# Patient Record
Sex: Female | Born: 1960 | Race: White | Hispanic: No | Marital: Married | Smoking: Never smoker
Health system: Southern US, Community
[De-identification: ages and names within clinical notes are randomized; demographics above are authoritative.]

## PROBLEM LIST (undated history)

## (undated) DIAGNOSIS — Z87898 Personal history of other specified conditions: Secondary | ICD-10-CM

## (undated) DIAGNOSIS — R413 Other amnesia: Secondary | ICD-10-CM

## (undated) DIAGNOSIS — N393 Stress incontinence (female) (male): Secondary | ICD-10-CM

## (undated) DIAGNOSIS — F32A Depression, unspecified: Secondary | ICD-10-CM

## (undated) DIAGNOSIS — F329 Major depressive disorder, single episode, unspecified: Secondary | ICD-10-CM

## (undated) HISTORY — DX: Personal history of other specified conditions: Z87.898

## (undated) HISTORY — DX: Other amnesia: R41.3

## (undated) HISTORY — PX: APPENDECTOMY: SHX54

## (undated) HISTORY — DX: Stress incontinence (female) (male): N39.3

## (undated) HISTORY — PX: ORIF FINGER / THUMB FRACTURE: SUR932

## (undated) HISTORY — PX: ABDOMINAL HYSTERECTOMY: SUR658

---

## 2014-09-18 ENCOUNTER — Inpatient Hospital Stay (HOSPITAL_COMMUNITY): Payer: BC Managed Care – PPO

## 2014-09-18 ENCOUNTER — Encounter (HOSPITAL_COMMUNITY): Payer: Self-pay | Admitting: Internal Medicine

## 2014-09-18 ENCOUNTER — Inpatient Hospital Stay (HOSPITAL_COMMUNITY)
Admission: EM | Admit: 2014-09-18 | Discharge: 2014-09-21 | DRG: 419 | Disposition: A | Discharge status: Home / Self Care | Payer: BC Managed Care – PPO | Source: Other Acute Inpatient Hospital | Attending: Internal Medicine | Admitting: Internal Medicine

## 2014-09-18 DIAGNOSIS — E876 Hypokalemia: Secondary | ICD-10-CM | POA: Diagnosis present

## 2014-09-18 DIAGNOSIS — Z87442 Personal history of urinary calculi: Secondary | ICD-10-CM | POA: Diagnosis not present

## 2014-09-18 DIAGNOSIS — F329 Major depressive disorder, single episode, unspecified: Secondary | ICD-10-CM | POA: Diagnosis present

## 2014-09-18 DIAGNOSIS — K810 Acute cholecystitis: Secondary | ICD-10-CM | POA: Diagnosis present

## 2014-09-18 DIAGNOSIS — R7881 Bacteremia: Secondary | ICD-10-CM | POA: Diagnosis present

## 2014-09-18 DIAGNOSIS — R740 Nonspecific elevation of levels of transaminase and lactic acid dehydrogenase [LDH]: Secondary | ICD-10-CM | POA: Diagnosis present

## 2014-09-18 DIAGNOSIS — K8000 Calculus of gallbladder with acute cholecystitis without obstruction: Principal | ICD-10-CM

## 2014-09-18 DIAGNOSIS — B954 Other streptococcus as the cause of diseases classified elsewhere: Secondary | ICD-10-CM | POA: Diagnosis present

## 2014-09-18 DIAGNOSIS — R109 Unspecified abdominal pain: Secondary | ICD-10-CM | POA: Diagnosis present

## 2014-09-18 DIAGNOSIS — Z8719 Personal history of other diseases of the digestive system: Secondary | ICD-10-CM

## 2014-09-18 DIAGNOSIS — Z79899 Other long term (current) drug therapy: Secondary | ICD-10-CM | POA: Diagnosis not present

## 2014-09-18 DIAGNOSIS — R5081 Fever presenting with conditions classified elsewhere: Secondary | ICD-10-CM | POA: Diagnosis present

## 2014-09-18 DIAGNOSIS — Z791 Long term (current) use of non-steroidal anti-inflammatories (NSAID): Secondary | ICD-10-CM | POA: Diagnosis not present

## 2014-09-18 DIAGNOSIS — K8050 Calculus of bile duct without cholangitis or cholecystitis without obstruction: Secondary | ICD-10-CM

## 2014-09-18 DIAGNOSIS — Z8249 Family history of ischemic heart disease and other diseases of the circulatory system: Secondary | ICD-10-CM | POA: Diagnosis not present

## 2014-09-18 DIAGNOSIS — K8020 Calculus of gallbladder without cholecystitis without obstruction: Secondary | ICD-10-CM | POA: Diagnosis present

## 2014-09-18 DIAGNOSIS — K6289 Other specified diseases of anus and rectum: Secondary | ICD-10-CM | POA: Diagnosis present

## 2014-09-18 DIAGNOSIS — R1011 Right upper quadrant pain: Secondary | ICD-10-CM

## 2014-09-18 HISTORY — DX: Depression, unspecified: F32.A

## 2014-09-18 HISTORY — DX: Major depressive disorder, single episode, unspecified: F32.9

## 2014-09-18 LAB — COMPREHENSIVE METABOLIC PANEL
ALT: 106 U/L — ABNORMAL HIGH (ref 0–35)
ANION GAP: 12 (ref 5–15)
AST: 101 U/L — ABNORMAL HIGH (ref 0–37)
Albumin: 2.6 g/dL — ABNORMAL LOW (ref 3.5–5.2)
Alkaline Phosphatase: 231 U/L — ABNORMAL HIGH (ref 39–117)
BILIRUBIN TOTAL: 0.5 mg/dL (ref 0.3–1.2)
BUN: 12 mg/dL (ref 6–23)
CALCIUM: 8 mg/dL — AB (ref 8.4–10.5)
CO2: 27 mEq/L (ref 19–32)
CREATININE: 0.98 mg/dL (ref 0.50–1.10)
Chloride: 101 mEq/L (ref 96–112)
GFR calc Af Amer: 74 mL/min — ABNORMAL LOW (ref >90)
GFR calc non Af Amer: 64 mL/min — ABNORMAL LOW (ref >90)
Glucose, Bld: 103 mg/dL — ABNORMAL HIGH (ref 70–99)
Potassium: 3.6 mEq/L — ABNORMAL LOW (ref 3.7–5.3)
Sodium: 140 mEq/L (ref 137–147)
Total Protein: 6.5 g/dL (ref 6.0–8.3)

## 2014-09-18 LAB — URINALYSIS, ROUTINE W REFLEX MICROSCOPIC
BILIRUBIN URINE: NEGATIVE
Glucose, UA: NEGATIVE mg/dL
Hgb urine dipstick: NEGATIVE
Ketones, ur: NEGATIVE mg/dL
Leukocytes, UA: NEGATIVE
NITRITE: NEGATIVE
Protein, ur: NEGATIVE mg/dL
SPECIFIC GRAVITY, URINE: 1.02 (ref 1.005–1.030)
pH: 7 (ref 5.0–8.0)

## 2014-09-18 LAB — CBC
HEMATOCRIT: 36.1 % (ref 36.0–46.0)
Hemoglobin: 11.7 g/dL — ABNORMAL LOW (ref 12.0–15.0)
MCH: 26.8 pg (ref 26.0–34.0)
MCHC: 32.4 g/dL (ref 30.0–36.0)
MCV: 82.8 fL (ref 78.0–100.0)
Platelets: 324 10*3/uL (ref 150–400)
RBC: 4.36 MIL/uL (ref 3.87–5.11)
RDW: 13.5 % (ref 11.5–15.5)
WBC: 10.1 10*3/uL (ref 4.0–10.5)

## 2014-09-18 LAB — LIPASE, BLOOD: LIPASE: 34 U/L (ref 11–59)

## 2014-09-18 MED ORDER — HYDROMORPHONE HCL 1 MG/ML IJ SOLN
0.5000 mg | INTRAMUSCULAR | Status: DC | PRN
  Administered 2014-09-19 (×2): 1 mg via INTRAVENOUS
  Filled 2014-09-18 (×2): qty 1

## 2014-09-18 MED ORDER — SODIUM CHLORIDE 0.9 % IV SOLN
INTRAVENOUS | Status: DC
  Administered 2014-09-18 – 2014-09-19 (×3): via INTRAVENOUS
  Administered 2014-09-19: 1000 mL via INTRAVENOUS
  Administered 2014-09-20: 10:00:00 via INTRAVENOUS

## 2014-09-18 MED ORDER — CIPROFLOXACIN IN D5W 400 MG/200ML IV SOLN
400.0000 mg | Freq: Two times a day (BID) | INTRAVENOUS | Status: DC
  Administered 2014-09-18 – 2014-09-20 (×6): 400 mg via INTRAVENOUS
  Filled 2014-09-18 (×7): qty 200

## 2014-09-18 MED ORDER — ACETAMINOPHEN 650 MG RE SUPP: 650.0000 mg | Freq: Four times a day (QID) | RECTAL | Status: DC | PRN

## 2014-09-18 MED ORDER — ONDANSETRON HCL 4 MG/2ML IJ SOLN: 4.0000 mg | Freq: Four times a day (QID) | INTRAMUSCULAR | Status: DC | PRN

## 2014-09-18 MED ORDER — ACETAMINOPHEN 325 MG PO TABS
650.0000 mg | ORAL_TABLET | Freq: Four times a day (QID) | ORAL | Status: DC | PRN
  Administered 2014-09-18: 650 mg via ORAL
  Filled 2014-09-18: qty 2

## 2014-09-18 MED ORDER — ONDANSETRON HCL 4 MG PO TABS: 4.0000 mg | ORAL_TABLET | Freq: Four times a day (QID) | ORAL | Status: DC | PRN

## 2014-09-18 NOTE — Progress Notes (Signed)
Pt. Arrived from [facility] via EMS. Pt. A&Ox4 VSS in no acute distress voiced no c/o pain. Currently resting well willcontinue to monitor.

## 2014-09-18 NOTE — H&P (Signed)
[facility] Admission History and Physical       Jamie Stephenson ZOX:[id number] DOB: December 12, 1960 DOA: 09/18/2014  Referring physician: EDP PCP: Berle Mull, MD  Specialists:   Chief Complaint: Fever and Right Upper ABD Pain  HPI: Jamie Stephenson is a 53 y.o. female who presented to the [facility] ED with complaints of Right Flank Pain 5 days ago and was evaluated and had a CT scan of the ABD /Pelvis (on 10/26) which revealed a Nonobstructing Kidney stone as well an Gall stones.   She was discharged home. In the interim she feels she may have passed the kidney stone, however, she began to have fevers and chills and Right upper quadrant ABD Pain 1 day ago.   Her temperature went up to 104 and she returned to the [facility] ED where she was found to have elevated LFTs.   Arrangements were made for transfer to [facility] since GI is not available on weekends at [facility].      Review of Systems:  Constitutional: No Weight Loss, No Weight Gain, Night Sweats, Fevers, Chills, Dizziness, Fatigue, or Generalized Weakness HEENT: No Headaches, Difficulty Swallowing,Tooth/Dental Problems,Sore Throat,  No Sneezing, Rhinitis, Ear Ache, Nasal Congestion, or Post Nasal Drip,  Cardio-vascular:  No Chest pain, Orthopnea, PND, Edema in Lower Extremities, Anasarca, Dizziness, Palpitations  Resp: No Dyspnea, No DOE, No Productive Cough, No Non-Productive Cough, No Hemoptysis, No Wheezing.    GI: No Heartburn, Indigestion, Abdominal Pain, Nausea, Vomiting, Diarrhea, Hematemesis, Hematochezia, Melena, Change in Bowel Habits,  Loss of Appetite  GU: No Dysuria, Change in Color of Urine, No Urgency or Frequency, No Flank pain.  Musculoskeletal: No Joint Pain or Swelling, No Decreased Range of Motion, No Back Pain.  Neurologic: No Syncope, No Seizures, Muscle Weakness, Paresthesia, Vision Disturbance or Loss, No Diplopia, No Vertigo, No Difficulty Walking,  Skin: No Rash or Lesions. Psych: No Change in  Mood or Affect, No Depression or Anxiety, No Memory loss, No Confusion, or Hallucinations   Past Medical History  Diagnosis Date  . Depression       Past Surgical History  Procedure Laterality Date  . Abdominal hysterectomy    . Orif finger / thumb fracture         Prior to Admission medications   Not on File   Wellbutrin  Estratest  OTC: Aleve  OTC Vitamin B-6    No Known Allergies   Social History:  reports that she has never smoked. She does not have any smokeless tobacco history on file. Her alcohol and drug histories are not on file.     Family History  Problem Relation Age of Onset  . Seizures Father   . Seizures Mother   . CAD Mother   . Hypertension Mother        Physical Exam:  GEN:  Pleasant Well Nourished and Well Developed  53 y.o. Caucasian female examined and in no acute distress; cooperative with exam Filed Vitals:   09/18/14 0242  BP: 116/73  Pulse: 68  Temp: 99 F (37.2 C)  TempSrc: Oral  Resp: 18  Height: 5\' 3"  (1.6 m)  Weight: 72.2 kg (159 lb 2.8 oz)  SpO2: 99%   Blood pressure 116/73, pulse 68, temperature 99 F (37.2 C), temperature source Oral, resp. rate 18, height 5\' 3"  (1.6 m), weight 72.2 kg (159 lb 2.8 oz), SpO2 99.00%. PSYCH: She is alert and oriented x4; does not appear anxious does not appear depressed; affect is normal HEENT: Normocephalic  and Atraumatic, Mucous membranes pink; PERRLA; EOM intact; Fundi:  Benign;  No scleral icterus, Nares: Patent, Oropharynx: Clear,Fair Dentition,    Neck:  FROM, No Cervical Lymphadenopathy nor Thyromegaly or Carotid Bruit; No JVD; Breasts:: Not examined CHEST WALL: No tenderness CHEST: Normal respiration, clear to auscultation bilaterally HEART: Regular rate and rhythm; no murmurs rubs or gallops BACK: No kyphosis or scoliosis; No CVA tenderness ABDOMEN: Positive Bowel Sounds,  Obese, Soft Mildly Tender in RUQ, No Rebound or Guarding,  No Organomegaly Rectal Exam: Not  done EXTREMITIES: No Cyanosis, Clubbing, or Edema; No Ulcerations. Genitalia: not examined PULSES: 2+ and symmetric SKIN: Normal hydration no rash or ulceration CNS:  Alert and Oriented x 4, No focal Deficits Vascular: pulses palpable throughout       Labs and Studies Prior to Admission: Reviewed    Assessment/Plan:    53 y.o. female with  Active Problems:   1.   Choledocholithiasis/ Abdominal pain   NPO   Consult GI   IV Protonix   IVFs      2.   Fever presenting with conditions classified elsewhere   Given IV Invanz in ED x 1 dose    3.   Hypokalemia    Replete K+, Check Magnesium     4.   DVT Prophylaxis   SCDs     Code Status:    FULL CODE  Family Communication:    Husband at Bedside Disposition Plan:      Inpatient  Time spent:   [place] [facility] Pager [phone]   If [place] Please Contact the Day Rounding Team MD for [facility]  If 7PM-7AM, Please Contact Night-Floor Coverage  [email or web address] Password [email or web address] 09/18/2014, 4:56 AM

## 2014-09-18 NOTE — Consult Note (Signed)
Re:   Jamie Stephenson DOB:   [birth year] MRN:   [id number]  [facility] Consultation  ASSESSMENT AND PLAN: 1.  Cholelithiasis, possible cholecystitis.  Seen by Dr. Carlean Purl  Atypical symptoms and presentation, but it difficult to separate from right kidney disease.  I think she is best served by proceeding with lap chole to remove potential source of some of her problems.  I discussed with the patient the indications and risks of gall bladder surgery.  The primary risks of gall bladder surgery include, but are not limited to, bleeding, infection, common bile duct injury, and open surgery.  There is also the risk that the patient may have continued symptoms after surgery.  However, the likelihood of improvement in symptoms and return to the patient's normal status is good. We discussed the typical post-operative recovery course. I tried to answer the patient's questions.  Plan lap cholecystectomy and cholangiogram tomorrow AM.  2.  Elevated LFT 3.  Kidney stones, right kidney  I sounds like she has renal colic in the spring of this year.  4.  History of depression  Taking care of 2 grandchildren at home.  [Epic acting glitchy - no telling what it will do to this note.]  No chief complaint on file.  REFERRING PHYSICIAN:   Louellen Molder, MD, hospitalist  HISTORY OF PRESENT ILLNESS: Jamie Stephenson is a 53 y.o. (DOB: 14-Apr-1961)  white  female whose primary care physician is Dr. Rory Percy [facility]) and was admitted today for right flank pain and fever.  This past spring she had some right flank pain which was diagnosed as possible renal colic.  This attack resolved and she had no more trouble until this week.  On Monday, 10/26, she developed right back pain.  She was seen at [facility] for right flank and right abdominal pain. A CT scan revealed kidney stones and gall stones.  She was sent home and doing better until she developed a fever and returned to [facility].  She was found to  have elevated LFT's and she was transferred to [facility] for further care of her gall bladder symptoms.  She has no GI history, she has never had a colonoscopy, her only abdominal surgery was a hysterectomy about 2000.  AST - 101, ALT - 106, Alk Phos - 231, T. Bili - 0.5 - 09/18/2014  [place] - 09/18/2014 - Cholelithiasis, CBD - 0.5 cm   Past Medical History  Diagnosis Date  . Depression       Past Surgical History  Procedure Laterality Date  . Abdominal hysterectomy    . Orif finger / thumb fracture        Current Facility-Administered Medications  Medication Dose Route Frequency Provider Last Rate Last Dose  . 0.9 %  sodium chloride infusion   Intravenous Continuous Nishant Dhungel, MD 100 mL/hr at 09/18/14 0331    . acetaminophen (TYLENOL) tablet 650 mg  650 mg Oral Q6H PRN Jamie Millard, MD   650 mg at 09/18/14 1318   Or  . acetaminophen (TYLENOL) suppository 650 mg  650 mg Rectal Q6H PRN Jamie Millard, MD      . ciprofloxacin (CIPRO) IVPB 400 mg  400 mg Intravenous Q12H Nishant Dhungel, MD   400 mg at 09/18/14 0928  . HYDROmorphone (DILAUDID) injection 0.5-1 mg  0.5-1 mg Intravenous Q3H PRN Jamie Millard, MD      . ondansetron (ZOFRAN) tablet 4 mg  4 mg Oral Q6H PRN Harvette C  Arnoldo Morale, MD       Or  . ondansetron [facility]) injection 4 mg  4 mg Intravenous Q6H PRN Jamie Millard, MD         No Known Allergies  REVIEW OF SYSTEMS: Skin:  No history of rash.  No history of abnormal moles. Infection:  No history of hepatitis or HIV.  No history of MRSA. Neurologic:  Seizures as a teen, but she said that she "grew out of them".  Etiology unclear. Cardiac:  No history of hypertension. No history of heart disease.  No history of prior cardiac catheterization.  No history of seeing a cardiologist. Pulmonary:  Does not smoke cigarettes.  No asthma or bronchitis.  No OSA/CPAP.  Endocrine:  No diabetes. No thyroid disease. Gastrointestinal:  See HPI. No history of stomach  disease.  No history of liver disease.    No history of pancreas disease.  No history of colon disease. Urologic:  Right nephrolithiasis.   Has not seen a urologist. GYN:  Hysterectomy around 2000 for endometriosis. Musculoskeletal:  Has chronic back pain for which she sees a Restaurant manager, fast food. Hematologic:  No bleeding disorder.  No history of anemia.  Not anticoagulated. Psycho-social:  The patient is oriented.   She has some depression.  She takes care of 2 grandchildren.  SOCIAL and FAMILY HISTORY: Married. Her sister, Vaughan Basta, is in the room with her.  PHYSICAL EXAM: BP 124/71  Pulse 71  Temp(Src) 98.9 F (37.2 C) (Oral)  Resp 16  Ht _0  (1.6 m)  Wt 159 lb 2.8 oz (72.2 kg)  BMI 28.20 kg/m2  SpO2 96%  General: WN WF who is alert and generally healthy appearing.  HEENT: Normal. Pupils equal. Neck: Supple. No mass.  No thyroid mass. Lymph Nodes:  No supraclavicular or cervical nodes. Lungs: Clear to auscultation and symmetric breath sounds. Heart:  RRR. No murmur or rub. Abdomen: Soft.  Normal bowel sounds.  No abdominal scars.  Sore right abdomen, but no peritoneal signs. Rectal: Not done. Extremities:  Good strength and ROM  in upper and lower extremities. Neurologic:  Grossly intact to motor and sensory function. Psychiatric: Has normal mood and affect. Behavior is normal.   DATA REVIEWED: Epic notes.  Alphonsa Overall, MD,  [facility], [address]  [place], [place]    [place] Phone:  [phone] FAX:  [phone]

## 2014-09-18 NOTE — Consult Note (Signed)
Referring Provider: [facility] Primary Care Physician:  Berle Mull, MD Primary Gastroenterologist:  none  Reason for Consultation:  RUQ pain, fever,chills ,gallstones  HPI: Jamie Stephenson is a 53 y.o. female who is generally in good health, admitted in transfer from [facility] emergency room last night after she presented with a temp of 104 and associated chills. Patient says she started having symptoms on Monday 1026 at about 5 AM in the morning. She states that she woke up with right flank pain. She went to the emergency room at [facility] and had a noncontrasted CT scan with no IV or oral contrast. She was told she probably had a tiny kidney stone on the right and a gallbladder full of gallstones. She was sent home with pain medication. She says by Wednesday her right flank pain had migrated around into the right midabdomen and had resolved. She felt fine on Thursday and then last evening developed a fever to 104 with shaking chills. He says in retrospect she also had a fever on Thursday but did not actually take her temperature just felt hot and had chills. She has not had any recurrence of right flank or right abdominal pain, no nausea vomiting diarrhea headache myalgias or arthralgias etc. She has not had recurrence of fever since admission to the hospital. She is being covered with Cipro. Labs of 10.1 hemoglobin 11.7 lipase 34, bilirubin is normal she does have a transaminitis. Upper abdominal ultrasound is pending.   Past Medical History  Diagnosis Date  . Depression     Past Surgical History  Procedure Laterality Date  . Abdominal hysterectomy    . Orif finger / thumb fracture      Prior to Admission medications   Medication Sig Start Date End Date Taking? Authorizing Provider  buPROPion (WELLBUTRIN XL) 300 MG 24 hr tablet Take 300 mg by mouth daily.   Yes Historical Provider, MD  estrogen-methylTESTOSTERone 0.625-1.25 MG per tablet Take 1 tablet by mouth daily.   Yes  Historical Provider, MD  naproxen sodium (ANAPROX) 220 MG tablet Take 220 mg by mouth daily.   Yes Historical Provider, MD  vitamin B-6 (PYRIDOXINE) 25 MG tablet Take 25 mg by mouth daily.   Yes Historical Provider, MD    Current Facility-Administered Medications  Medication Dose Route Frequency Provider Last Rate Last Dose  . 0.9 %  sodium chloride infusion   Intravenous Continuous Nishant Dhungel, MD 100 mL/hr at 09/18/14 0331    . acetaminophen (TYLENOL) tablet 650 mg  650 mg Oral Q6H PRN Theressa Millard, MD   650 mg at 09/18/14 1318   Or  . acetaminophen (TYLENOL) suppository 650 mg  650 mg Rectal Q6H PRN Theressa Millard, MD      . ciprofloxacin (CIPRO) IVPB 400 mg  400 mg Intravenous Q12H Nishant Dhungel, MD   400 mg at 09/18/14 0928  . HYDROmorphone (DILAUDID) injection 0.5-1 mg  0.5-1 mg Intravenous Q3H PRN Theressa Millard, MD      . ondansetron (ZOFRAN) tablet 4 mg  4 mg Oral Q6H PRN Theressa Millard, MD       Or  . ondansetron (ZOFRAN) injection 4 mg  4 mg Intravenous Q6H PRN Theressa Millard, MD        Allergies as of 09/17/2014  . NKDA    Family History  Problem Relation Age of Onset  . Seizures Father   . Seizures Mother   . CAD Mother   . Hypertension Mother  History   Social History  . Marital Status: Married    Social History Main Topics  . Smoking status: Never Smoker     Review of Systems: Pertinent positive and negative review of systems were noted in the above HPI section.  All other review of systems was otherwise negative.  Physical Exam: Vital signs in last 24 hours: Temp:  [97.7 F (36.5 C)-100 F (37.8 C)] 98.9 F (37.2 C) (10/31 1421) Pulse Rate:  [68-71] 71 (10/31 1421) Resp:  [16-18] 16 (10/31 1421) BP: (116-124)/(71-73) 124/71 mmHg (10/31 1421) SpO2:  [96 %-99 %] 96 % (10/31 1421) Weight:  [159 lb 2.8 oz (72.2 kg)] 159 lb 2.8 oz (72.2 kg) (10/31 0242)   General:   Alert,  Well-developed, well-nourished,WF pleasant and  cooperative in NAD Head:  Normocephalic and atraumatic. Eyes:  Sclera clear, no icterus.   Conjunctiva pink. Ears:  Normal auditory acuity. Nose:  No deformity, discharge,  or lesions. Mouth:  No deformity or lesions.   Neck:  Supple; no masses or thyromegaly. Lungs:  Clear throughout to auscultation.   No wheezes, crackles, or rhonchi. Heart:  Regular rate and rhythm; no murmurs, clicks, rubs,  or gallops. Abdomen:  Soft,tender RUQ and RMQ, BS active,nonpalp mass or hsm,no rebound  Rectal:  Deferred  Msk:  Symmetrical without gross deformities. . Pulses:  Normal pulses noted. Extremities:  Without clubbing or edema. Neurologic:  Alert and  oriented x4;  grossly normal neurologically. Skin:  Intact without significant lesions or rashes.. Psych:  Alert and cooperative. Normal mood and affect.  Lab Results:  Recent Labs  09/18/14 0524  WBC 10.1  HGB 11.7*  HCT 36.1  PLT 324   BMET  Recent Labs  09/18/14 0524  NA 140  K 3.6*  CL 101  CO2 27  GLUCOSE 103*  BUN 12  CREATININE 0.98  CALCIUM 8.0*   LFT  Recent Labs  09/18/14 0524  PROT 6.5  ALBUMIN 2.6*  AST 101*  ALT 106*  ALKPHOS 231*  BILITOT 0.5    IMPRESSION:  #74 53 year old female with a 6 day history of illness onset with right flank pain which was present for about 3 days then resolved and onset of intermittent fever and chills with high fever last evening precipitating admission. She has no current complaints of abdominal pain is tender on exam in the right upper quadrant. LFTs are elevated bilirubin is normal. We'll need to rule out acute cholecystitis, choledocholithiasis.  PLAN:  #1 Upper abdominal ultrasound this morning-we will review him then decide if MRCP indicated versus ERCP. #2 continue IV Cipro #3 repeat labs in a.m., check UA today #4 start clear liquids post ultrasound We'll follow with you    Silvano Rusk  09/18/2014, 2:37 PM  [facility] Attending  I have also seen and assessed  the patient and agree with the above note. I looked at [facility] and no ductal dilation (no contrast) - had a ca++ gallstone and a kidney stone in the kidney US here shows 6 mm GB wall and NL bile ducts  Seems like an atypical cholecystitis  Await [facility] evaluation No role for MRCP or ERCP now  Gatha Mayer, MD, [facility] [phone] (pager) 09/18/2014 2:37 PM

## 2014-09-18 NOTE — Progress Notes (Signed)
[facility] PROGRESS NOTE  Jamie Stephenson KZL:[id number] DOB: 1961-10-30 DOA: 09/18/2014 PCP: Berle Mull, MD Brief narrative  53 year old female with no significant past medical history was transferred from [facility] with right upper quadrant pain with fever of 140s Fahrenheit and chills. Patient reports having right flank pain on 10/26 for which she was seen in the ED and had noncontrast CT of the abdomen which showed tiny right kidney stone and gallstones. She was discharged home after some pain medications were given but returned back to the ED with fever and chills with right upper quadrant pain. Reportedly she was found to have a transaminitis and since they did not have any GI service available she was transferred to [facility] hospital for further management. Patient reports having nausea but no vomiting.  Assessment/Plan: Cholecystitis/choledocholithiasis with ? Acute cholangitis Noted for transaminitis with normal bilirubin and lipase  ( AS/ ALT in low 100 and ALKP  of 216) Ultrasound abdomen  shows nonspecific gallbladder wall thickening with cholelithiasis. Murphy sign was negative. GI and general surgery consulted. We'll plan on MRCP versus HIDA scan based on further recommendations Monitor LFTs closely. Obtain  lab and CT results from [facility]. Clear liquids. With this on empiric ciprofloxacin -Abortive care with IV fluids, pain control and antiemetics  DVT Prophylaxis: Subcutaneous Lovenox  Diet: Clear liquids  Code Status: Full code Family Communication: Husband at bedside Disposition Plan: Currently inpatient. Home once workup completed   Consultants:  [facility]  [facility]  Procedures:  ultrasound abdomen  Antibiotics:  The ciprofloxacin  HPI/Subjective: Patient seen and examined. Complains of right upper quadrant pain and some nausea  Objective: Filed Vitals:   09/18/14 1318  BP:   Pulse:   Temp: 100 F (37.8 C)  Resp:      Intake/Output Summary (Last 24 hours) at 09/18/14 1348 Last data filed at 09/18/14 0541  Gross per 24 hour  Intake 216.67 ml  Output      0 ml  Net 216.67 ml   Filed Weights   09/18/14 0242  Weight: 72.2 kg (159 lb 2.8 oz)    Exam:   General:  Elderly aged female in no acute distress  HEENT: No pallor, no icterus, moist oral mucosa  Chest: Clear to auscultation bilaterally on CVS: Normal S1 and S2, no murmurs  Abdomen: Soft, nondistended, bowel sounds present, right upper quadrant tenderness  Extremities: Warm, no edema  CNS: Alert and oriented    Data Reviewed: Basic Metabolic Panel:  Recent Labs Lab 09/18/14 0524  NA 140  K 3.6*  CL 101  CO2 27  GLUCOSE 103*  BUN 12  CREATININE 0.98  CALCIUM 8.0*   Liver Function Tests:  Recent Labs Lab 09/18/14 0524  AST 101*  ALT 106*  ALKPHOS 231*  BILITOT 0.5  PROT 6.5  ALBUMIN 2.6*    Recent Labs Lab 09/18/14 0524  LIPASE 34   No results found for this basename: AMMONIA,  in the last 168 hours CBC:  Recent Labs Lab 09/18/14 0524  WBC 10.1  HGB 11.7*  HCT 36.1  MCV 82.8  PLT 324   Cardiac Enzymes: No results found for this basename: CKTOTAL, CKMB, CKMBINDEX, TROPONINI,  in the last 168 hours BNP (last 3 results) No results found for this basename: PROBNP,  in the last 8760 hours CBG: No results found for this basename: GLUCAP,  in the last 168 hours  No results found for this or any previous visit (from the past 240 hour(s)).  Studies: US Abdomen Complete  09/18/2014   CLINICAL DATA:  Right upper quadrant pain. Three days. Abnormal liver function tests.  EXAM: ULTRASOUND ABDOMEN COMPLETE  COMPARISON:  09/13/2014  FINDINGS: Gallbladder: Multiple gallstones. Largest is 1.2 cm. Gallbladder wall is mildly thickened at 6 mm. No Murphy's sign.  Common bile duct: Diameter: 5 mm in caliber.  Liver: No focal lesion identified. Within normal limits in parenchymal echogenicity.  IVC: No  abnormality visualized.  Pancreas: Visualized portion unremarkable.  Spleen: Size and appearance within normal limits.  Right Kidney: Length: 10.3 cm in length. Echogenicity within normal limits. No mass or hydronephrosis visualized.  Left Kidney: Length: 10.7 cm in length. Echogenicity within normal limits. No mass or hydronephrosis visualized.  Abdominal aorta: No aneurysm visualized.  Other findings: None.  IMPRESSION: Cholelithiasis.  There is nonspecific gallbladder wall thickening. Correlate clinically as for the need for nuclear medicine imaging to evaluate for cystic duct patency. Please note that Percell Miller sign was negative.   Electronically Signed   By: Maryclare Bean M.D.   On: 09/18/2014 12:22    Scheduled Meds: . ciprofloxacin  400 mg Intravenous Q12H   Continuous Infusions: . sodium chloride 100 mL/hr at 09/18/14 0331       Time spent: 25 minutes    Deondra Wigger, [place]  [facility] Pager [phone]. If 7PM-7AM, please contact night-coverage at [email or web address], password [facility] 09/18/2014, 1:48 PM  LOS: 0 days

## 2014-09-19 ENCOUNTER — Encounter (HOSPITAL_COMMUNITY): Payer: Self-pay | Admitting: Anesthesiology

## 2014-09-19 ENCOUNTER — Inpatient Hospital Stay (HOSPITAL_COMMUNITY): Payer: BC Managed Care – PPO

## 2014-09-19 ENCOUNTER — Inpatient Hospital Stay (HOSPITAL_COMMUNITY): Payer: BC Managed Care – PPO | Admitting: *Deleted

## 2014-09-19 ENCOUNTER — Encounter (HOSPITAL_COMMUNITY)
Admission: EM | Disposition: A | Discharge status: Home / Self Care | Payer: Self-pay | Source: Other Acute Inpatient Hospital | Attending: Internal Medicine

## 2014-09-19 DIAGNOSIS — R7881 Bacteremia: Secondary | ICD-10-CM | POA: Diagnosis present

## 2014-09-19 DIAGNOSIS — K810 Acute cholecystitis: Secondary | ICD-10-CM | POA: Diagnosis present

## 2014-09-19 DIAGNOSIS — K8000 Calculus of gallbladder with acute cholecystitis without obstruction: Secondary | ICD-10-CM | POA: Diagnosis present

## 2014-09-19 HISTORY — PX: CHOLECYSTECTOMY: SHX55

## 2014-09-19 LAB — CBC
HEMATOCRIT: 35.2 % — AB (ref 36.0–46.0)
Hemoglobin: 11.3 g/dL — ABNORMAL LOW (ref 12.0–15.0)
MCH: 26.4 pg (ref 26.0–34.0)
MCHC: 32.1 g/dL (ref 30.0–36.0)
MCV: 82.2 fL (ref 78.0–100.0)
Platelets: 315 10*3/uL (ref 150–400)
RBC: 4.28 MIL/uL (ref 3.87–5.11)
RDW: 13.6 % (ref 11.5–15.5)
WBC: 11.1 10*3/uL — AB (ref 4.0–10.5)

## 2014-09-19 LAB — COMPREHENSIVE METABOLIC PANEL
ALT: 97 U/L — AB (ref 0–35)
AST: 73 U/L — AB (ref 0–37)
Albumin: 2.5 g/dL — ABNORMAL LOW (ref 3.5–5.2)
Alkaline Phosphatase: 231 U/L — ABNORMAL HIGH (ref 39–117)
Anion gap: 13 (ref 5–15)
BILIRUBIN TOTAL: 0.5 mg/dL (ref 0.3–1.2)
BUN: 9 mg/dL (ref 6–23)
CHLORIDE: 100 meq/L (ref 96–112)
CO2: 24 mEq/L (ref 19–32)
Calcium: 8.3 mg/dL — ABNORMAL LOW (ref 8.4–10.5)
Creatinine, Ser: 0.86 mg/dL (ref 0.50–1.10)
GFR calc Af Amer: 87 mL/min — ABNORMAL LOW (ref >90)
GFR calc non Af Amer: 75 mL/min — ABNORMAL LOW (ref >90)
Glucose, Bld: 88 mg/dL (ref 70–99)
Potassium: 4 mEq/L (ref 3.7–5.3)
Sodium: 137 mEq/L (ref 137–147)
Total Protein: 6.4 g/dL (ref 6.0–8.3)

## 2014-09-19 LAB — SURGICAL PCR SCREEN
MRSA, PCR: NEGATIVE
Staphylococcus aureus: NEGATIVE

## 2014-09-19 SURGERY — LAPAROSCOPIC CHOLECYSTECTOMY WITH INTRAOPERATIVE CHOLANGIOGRAM
Anesthesia: General | Site: Abdomen

## 2014-09-19 MED ORDER — PROPOFOL 10 MG/ML IV BOLUS
INTRAVENOUS | Status: AC
  Filled 2014-09-19: qty 20

## 2014-09-19 MED ORDER — BUPIVACAINE-EPINEPHRINE (PF) 0.25% -1:200000 IJ SOLN
INTRAMUSCULAR | Status: AC
  Filled 2014-09-19: qty 30

## 2014-09-19 MED ORDER — DEXTROSE 5 % IV SOLN
INTRAVENOUS | Status: AC
  Filled 2014-09-19: qty 2

## 2014-09-19 MED ORDER — BUPIVACAINE-EPINEPHRINE 0.25% -1:200000 IJ SOLN
INTRAMUSCULAR | Status: DC | PRN
  Administered 2014-09-19: 8 mL

## 2014-09-19 MED ORDER — PROMETHAZINE HCL 25 MG/ML IJ SOLN: 6.2500 mg | INTRAMUSCULAR | Status: DC | PRN

## 2014-09-19 MED ORDER — GLYCOPYRROLATE 0.2 MG/ML IJ SOLN
INTRAMUSCULAR | Status: DC | PRN
  Administered 2014-09-19: .8 mg via INTRAVENOUS

## 2014-09-19 MED ORDER — FENTANYL CITRATE 0.05 MG/ML IJ SOLN
INTRAMUSCULAR | Status: DC | PRN
  Administered 2014-09-19 (×2): 100 ug via INTRAVENOUS
  Administered 2014-09-19: 50 ug via INTRAVENOUS

## 2014-09-19 MED ORDER — DEXAMETHASONE SODIUM PHOSPHATE 10 MG/ML IJ SOLN
INTRAMUSCULAR | Status: DC | PRN
  Administered 2014-09-19: 10 mg via INTRAVENOUS

## 2014-09-19 MED ORDER — MIDAZOLAM HCL 5 MG/5ML IJ SOLN
INTRAMUSCULAR | Status: DC | PRN
  Administered 2014-09-19: 2 mg via INTRAVENOUS

## 2014-09-19 MED ORDER — HYDROMORPHONE HCL 1 MG/ML IJ SOLN
INTRAMUSCULAR | Status: AC
  Administered 2014-09-19: 1 mg via INTRAVENOUS
  Filled 2014-09-19: qty 1

## 2014-09-19 MED ORDER — VANCOMYCIN HCL IN DEXTROSE 750-5 MG/150ML-% IV SOLN
750.0000 mg | Freq: Three times a day (TID) | INTRAVENOUS | Status: DC
  Administered 2014-09-19 – 2014-09-21 (×5): 750 mg via INTRAVENOUS
  Filled 2014-09-19 (×7): qty 150

## 2014-09-19 MED ORDER — HYDROMORPHONE HCL 1 MG/ML IJ SOLN
0.2500 mg | INTRAMUSCULAR | Status: DC | PRN
  Administered 2014-09-19 (×4): 0.5 mg via INTRAVENOUS

## 2014-09-19 MED ORDER — SUCCINYLCHOLINE CHLORIDE 20 MG/ML IJ SOLN
INTRAMUSCULAR | Status: DC | PRN
  Administered 2014-09-19: 100 mg via INTRAVENOUS

## 2014-09-19 MED ORDER — DEXTROSE 5 % IV SOLN
2.0000 g | INTRAVENOUS | Status: DC | PRN
  Administered 2014-09-19: 2 g via INTRAVENOUS

## 2014-09-19 MED ORDER — ONDANSETRON HCL 4 MG/2ML IJ SOLN
INTRAMUSCULAR | Status: DC | PRN
  Administered 2014-09-19: 4 mg via INTRAVENOUS

## 2014-09-19 MED ORDER — FENTANYL CITRATE 0.05 MG/ML IJ SOLN
INTRAMUSCULAR | Status: AC
  Filled 2014-09-19: qty 5

## 2014-09-19 MED ORDER — LACTATED RINGERS IR SOLN
Status: DC | PRN
  Administered 2014-09-19: 2000 mL

## 2014-09-19 MED ORDER — LACTATED RINGERS IV SOLN
INTRAVENOUS | Status: DC | PRN
  Administered 2014-09-19: 13:00:00 via INTRAVENOUS

## 2014-09-19 MED ORDER — IOHEXOL 300 MG/ML  SOLN
INTRAMUSCULAR | Status: DC | PRN
  Administered 2014-09-19: 10 mL

## 2014-09-19 MED ORDER — ROCURONIUM BROMIDE 100 MG/10ML IV SOLN
INTRAVENOUS | Status: DC | PRN
  Administered 2014-09-19: 30 mg via INTRAVENOUS
  Administered 2014-09-19: 10 mg via INTRAVENOUS
  Administered 2014-09-19: 5 mg via INTRAVENOUS

## 2014-09-19 MED ORDER — 0.9 % SODIUM CHLORIDE (POUR BTL) OPTIME
TOPICAL | Status: DC | PRN
  Administered 2014-09-19: 1000 mL

## 2014-09-19 MED ORDER — PROPOFOL 10 MG/ML IV BOLUS
INTRAVENOUS | Status: DC | PRN
  Administered 2014-09-19: 200 mg via INTRAVENOUS

## 2014-09-19 MED ORDER — HYDROMORPHONE HCL 1 MG/ML IJ SOLN
INTRAMUSCULAR | Status: AC
  Filled 2014-09-19: qty 1

## 2014-09-19 MED ORDER — LIDOCAINE HCL (CARDIAC) 20 MG/ML IV SOLN
INTRAVENOUS | Status: DC | PRN
  Administered 2014-09-19: 100 mg via INTRAVENOUS

## 2014-09-19 MED ORDER — NEOSTIGMINE METHYLSULFATE 10 MG/10ML IV SOLN
INTRAVENOUS | Status: DC | PRN
  Administered 2014-09-19: 5 mg via INTRAVENOUS

## 2014-09-19 MED ORDER — MIDAZOLAM HCL 2 MG/2ML IJ SOLN
INTRAMUSCULAR | Status: AC
  Filled 2014-09-19: qty 2

## 2014-09-19 SURGICAL SUPPLY — 39 items
APPLIER CLIP ROT 10 11.4 M/L (STAPLE) ×3
BENZOIN TINCTURE PRP APPL 2/3 (GAUZE/BANDAGES/DRESSINGS) IMPLANT
CANISTER SUCTION 2500CC (MISCELLANEOUS) ×3 IMPLANT
CHLORAPREP W/TINT 26ML (MISCELLANEOUS) ×3 IMPLANT
CHOLANGIOGRAM CATH TAUT (CATHETERS) IMPLANT
CLIP APPLIE ROT 10 11.4 M/L (STAPLE) ×1 IMPLANT
CLOSURE WOUND 1/4X4 (GAUZE/BANDAGES/DRESSINGS)
COVER MAYO STAND STRL (DRAPES) ×3 IMPLANT
DECANTER SPIKE VIAL GLASS SM (MISCELLANEOUS) ×3 IMPLANT
DRAPE C-ARM 42X120 X-RAY (DRAPES) ×3 IMPLANT
DRAPE LAPAROSCOPIC ABDOMINAL (DRAPES) ×3 IMPLANT
DRAPE UTILITY XL STRL (DRAPES) ×3 IMPLANT
ELECT REM PT RETURN 9FT ADLT (ELECTROSURGICAL) ×3
ELECTRODE REM PT RTRN 9FT ADLT (ELECTROSURGICAL) ×1 IMPLANT
GLOVE SURG SIGNA 7.5 PF LTX (GLOVE) ×3 IMPLANT
GOWN SPEC L4 XLG W/TWL (GOWN DISPOSABLE) ×3 IMPLANT
GOWN STRL REUS W/ TWL XL LVL3 (GOWN DISPOSABLE) ×3 IMPLANT
GOWN STRL REUS W/TWL XL LVL3 (GOWN DISPOSABLE) ×6
HEMOSTAT SNOW SURGICEL 2X4 (HEMOSTASIS) ×3 IMPLANT
HEMOSTAT SURGICEL 4X8 (HEMOSTASIS) IMPLANT
IV CATH 14GX2 1/4 (CATHETERS) ×3 IMPLANT
IV SET MACRO CATH EXT 6 LUER (IV SETS) IMPLANT
KIT BASIN OR (CUSTOM PROCEDURE TRAY) ×3 IMPLANT
LIQUID BAND (GAUZE/BANDAGES/DRESSINGS) ×3 IMPLANT
NS IRRIG 1000ML POUR BTL (IV SOLUTION) ×3 IMPLANT
POUCH SPECIMEN RETRIEVAL 10MM (ENDOMECHANICALS) IMPLANT
SET IRRIG TUBING LAPAROSCOPIC (IRRIGATION / IRRIGATOR) ×3 IMPLANT
SLEEVE XCEL OPT CAN 5 100 (ENDOMECHANICALS) ×6 IMPLANT
SOLUTION ANTI FOG 6CC (MISCELLANEOUS) ×3 IMPLANT
STOPCOCK 4 WAY LG BORE MALE ST (IV SETS) ×3 IMPLANT
STRIP CLOSURE SKIN 1/4X4 (GAUZE/BANDAGES/DRESSINGS) IMPLANT
SUT VIC AB 5-0 PS2 18 (SUTURE) ×3 IMPLANT
TOWEL OR 17X26 10 PK STRL BLUE (TOWEL DISPOSABLE) ×3 IMPLANT
TOWEL OR NON WOVEN STRL DISP B (DISPOSABLE) ×3 IMPLANT
TRAY LAPAROSCOPIC (CUSTOM PROCEDURE TRAY) ×3 IMPLANT
TROCAR BLADELESS OPT 5 100 (ENDOMECHANICALS) ×3 IMPLANT
TROCAR XCEL BLUNT TIP 100MML (ENDOMECHANICALS) ×3 IMPLANT
TROCAR XCEL NON-BLD 11X100MML (ENDOMECHANICALS) ×3 IMPLANT
TUBING INSUFFLATION 10FT LAP (TUBING) ×3 IMPLANT

## 2014-09-19 NOTE — Transfer of Care (Signed)
Immediate Anesthesia Transfer of Care Note  Patient: Jamie Stephenson  Procedure(s) Performed: Procedure(s): LAPAROSCOPIC CHOLECYSTECTOMY WITH INTRAOPERATIVE CHOLANGIOGRAM (N/A)  Patient Location: PACU  Anesthesia Type:General  Level of Consciousness: awake, alert  and oriented  Airway & Oxygen Therapy: Patient Spontanous Breathing and Patient connected to face mask oxygen  Post-op Assessment: Report given to PACU RN and Post -op Vital signs reviewed and stable  Post vital signs: Reviewed and stable  Complications: No apparent anesthesia complications

## 2014-09-19 NOTE — Progress Notes (Signed)
Transferred to OR via bed.

## 2014-09-19 NOTE — Progress Notes (Signed)
CRITICAL VALUE ALERT  Critical value received:  + Blood Culture  Date of notification:  09/19/14  Time of notification:  0510  Critical value read back:faxed  Nurse who received alert:  Jearld Shines  MD notified (1st page):  Doreene Burke  Time of first page:  0512  MD notified (2nd page):  Time of second page:  Responding MD:  Doreene Burke  Time MD responded:  [phone]

## 2014-09-19 NOTE — Progress Notes (Signed)
[facility] PROGRESS NOTE  BONNE WHACK CHY:[id number] DOB: 02/17/1961 DOA: 09/18/2014 PCP: Rory Percy, MD    Brief narrative  53 year old female with no significant past medical history was transferred from [facility] with right upper quadrant pain with fever of 104 degree Fahrenheit and chills. Patient reports having right flank pain on 10/26 for which she was seen in the ED and had noncontrast CT of the abdomen which showed tiny right kidney stone and gallstones. She was discharged home after some pain medications were given but returned back to the ED with fever and chills with right upper quadrant pain. Reportedly she was found to have a transaminitis and since they did not have any GI service available she was transferred to [facility] for further management.   Assessment/Plan: Cholecystitis/choledocholithiasis with ? Acute cholangitis -transaminitis with normal bilirubin and lipase  ( AS/ ALT in low 100 and ALKP  of 216) Ultrasound abdomen  shows nonspecific gallbladder wall thickening with cholelithiasis.  -seen by GI and general surgery. Laproscopic cholecystectomy with IOC done today. ( normal cholangiogram noted) . Monitor LFTs in a.m. -supportive care with IV fluids, antiemetics and pain medication  ? Bacteremia possibly due to cholangitis Blood cultures from [facility] ED sent on 10/30 growing gram-positive cocci( initial result was faxed to [place] on 11/1). Repeat blood culture ordered this morning. Continue ciprofloxacin. Will add empiric vancomycin. Please follow-up with blood culture result at [facility] lab on 11/2  DVT Prophylaxis: Subcutaneous Lovenox  Diet: nothing by mouth for cholecystectomy  Code Status: Full code Family Communication: Husband at bedside Disposition Plan: Currently inpatient. Home possibly tomorrow   Consultants:  [facility]  [facility]  Procedures:  ultrasound abdomen  Antibiotics:  The  ciprofloxacin  HPI/Subjective: Patient seen and examined. Complains of right upper quadrant pain and some nausea  Objective: Filed Vitals:   09/19/14 1545  BP: 137/72  Pulse: 75  Temp: 98.4 F (36.9 C)  Resp: 14    Intake/Output Summary (Last 24 hours) at 09/19/14 1551 Last data filed at 09/19/14 1530  Gross per 24 hour  Intake 3988.25 ml  Output   1950 ml  Net 2038.25 ml   Filed Weights   09/18/14 0242  Weight: 72.2 kg (159 lb 2.8 oz)    Exam:   General:   no acute distress  HEENT:  moist oral mucosa  Chest: Clear to auscultation bilaterally   CVS: Normal S1 and S2, no murmurs  Abdomen: Soft, nondistended, bowel sounds present, no right upper quadrant tenderness  Extremities: Warm, no edema      Data Reviewed: Basic Metabolic Panel:  Recent Labs Lab 09/18/14 0524 09/19/14 0455  NA 140 137  K 3.6* 4.0  CL 101 100  CO2 27 24  GLUCOSE 103* 88  BUN 12 9  CREATININE 0.98 0.86  CALCIUM 8.0* 8.3*   Liver Function Tests:  Recent Labs Lab 09/18/14 0524 09/19/14 0455  AST 101* 73*  ALT 106* 97*  ALKPHOS 231* 231*  BILITOT 0.5 0.5  PROT 6.5 6.4  ALBUMIN 2.6* 2.5*    Recent Labs Lab 09/18/14 0524  LIPASE 34   No results for input(s): AMMONIA in the last 168 hours. CBC:  Recent Labs Lab 09/18/14 0524 09/19/14 0455  WBC 10.1 11.1*  HGB 11.7* 11.3*  HCT 36.1 35.2*  MCV 82.8 82.2  PLT 324 315   Cardiac Enzymes: No results for input(s): CKTOTAL, CKMB, CKMBINDEX, TROPONINI in the last 168 hours. BNP (last 3 results) No results  for input(s): PROBNP in the last 8760 hours. CBG: No results for input(s): GLUCAP in the last 168 hours.  Recent Results (from the past 240 hour(s))  Surgical pcr screen     Status: None   Collection Time: 09/19/14  7:34 AM  Result Value Ref Range Status   MRSA, PCR NEGATIVE NEGATIVE Final   Staphylococcus aureus NEGATIVE NEGATIVE Final    Comment:        The Xpert SA Assay (FDA approved for NASAL  specimens in patients over 66 years of age), is one component of a comprehensive surveillance program.  Test performance has been validated by [employer] for patients greater than or equal to 74 year old. It is not intended to diagnose infection nor to guide or monitor treatment.      Studies: Dg Cholangiogram Operative  09/19/2014   CLINICAL DATA:  Cholecystectomy for cholelithiasis and cholecystitis.  EXAM: INTRAOPERATIVE CHOLANGIOGRAM  TECHNIQUE: Cholangiographic images from the C-arm fluoroscopic device were submitted for interpretation post-operatively. Please see the procedural report for the amount of contrast and the fluoroscopy time utilized.  COMPARISON:  Ultrasound on 09/18/2014  FINDINGS: Intraoperative cholangiogram shows a normal opacified biliary tree without evidence of filling defect or biliary obstruction. Contrast enters the duodenum normally. No extravasation of contrast identified.  IMPRESSION: Normal intraoperative cholangiogram.   Electronically Signed   By: Aletta Edouard M.D.   On: 09/19/2014 15:31   US Abdomen Complete  09/18/2014   CLINICAL DATA:  Right upper quadrant pain. Three days. Abnormal liver function tests.  EXAM: ULTRASOUND ABDOMEN COMPLETE  COMPARISON:  09/13/2014  FINDINGS: Gallbladder: Multiple gallstones. Largest is 1.2 cm. Gallbladder wall is mildly thickened at 6 mm. No Murphy's sign.  Common bile duct: Diameter: 5 mm in caliber.  Liver: No focal lesion identified. Within normal limits in parenchymal echogenicity.  IVC: No abnormality visualized.  Pancreas: Visualized portion unremarkable.  Spleen: Size and appearance within normal limits.  Right Kidney: Length: 10.3 cm in length. Echogenicity within normal limits. No mass or hydronephrosis visualized.  Left Kidney: Length: 10.7 cm in length. Echogenicity within normal limits. No mass or hydronephrosis visualized.  Abdominal aorta: No aneurysm visualized.  Other findings: None.  IMPRESSION:  Cholelithiasis.  There is nonspecific gallbladder wall thickening. Correlate clinically as for the need for nuclear medicine imaging to evaluate for cystic duct patency. Please note that Percell Miller sign was negative.   Electronically Signed   By: Maryclare Bean M.D.   On: 09/18/2014 12:22    Scheduled Meds: . ciprofloxacin  400 mg Intravenous Q12H  . HYDROmorphone      . HYDROmorphone       Continuous Infusions: . sodium chloride 75 mL/hr at 09/19/14 1548       Time spent: 25 minutes    Macyn Remmert, [place]  [facility] Pager [phone]. If 7PM-7AM, please contact night-coverage at [email or web address], password [facility] 09/19/2014, 3:51 PM  LOS: 1 day

## 2014-09-19 NOTE — Op Note (Signed)
09/18/2014 - 09/19/2014  2:51 PM  PATIENT:  Jamie Stephenson  53 y.o. female  Patient Care Team: Rory Percy, MD as PCP - General (Family Medicine)  PRE-OPERATIVE DIAGNOSIS:  Symptomatic cholelithiasis  POST-OPERATIVE DIAGNOSIS:  Acute cholecystitis  PROCEDURE:  LAPAROSCOPIC CHOLECYSTECTOMY WITH INTRAOPERATIVE CHOLANGIOGRAM  SURGEON:  Surgeon(s): Leighton Ruff, MD Armandina Gemma, MD  ASSISTANT: Harlow Asa   ANESTHESIA:   local and general  EBL: 42ml Total I/O In: 1246.3 [I.V.:1046.3; IV Piggyback:200] Out: 1250 [Urine:1200; Blood:50]  DRAINS: none   SPECIMEN:  Source of Specimen:  gallbladder  DISPOSITION OF SPECIMEN:  PATHOLOGY  COUNTS:  YES  PLAN OF CARE: patient admitted  PATIENT DISPOSITION:  PACU - hemodynamically stable.  INDICATION: This is a 53 y.o. F with R flank pain and elevated LFTs.  She was transferred here due to concern for a gallbladder pathology.    The anatomy & physiology of hepatobiliary & pancreatic function was discussed.  The pathophysiology of gallbladder dysfunction was discussed.  Natural history risks without surgery was discussed.   I feel the risks of no intervention will lead to serious problems that outweigh the operative risks; therefore, I recommended cholecystectomy to remove the pathology.  I explained laparoscopic techniques with possible need for an open approach.  Probable cholangiogram to evaluate the bilary tract was explained as well.    Risks such as bleeding, infection, abscess, leak, injury to other organs, need for further treatment, heart attack, death, and other risks were discussed.  I noted a good likelihood this will help address the problem.  Possibility that this will not correct all abdominal symptoms was explained.  Goals of post-operative recovery were discussed as well.    OR FINDINGS: acute cholecystitis, normal IOC  DESCRIPTION:   The patient was identified & brought into the operating room. The patient was positioned  supine with arms tucked. SCDs were active during the entire case. The patient underwent general anesthesia without any difficulty.  The abdomen was prepped and draped in a sterile fashion. A Surgical Timeout was performed and confirmed our plan.  We positioned the patient in reverse [place] & right side up.  I placed a Hassan laparoscopic port through the umbilicus using open entry technique.  Entry was clean. There were no adhesions to the anterior abdominal wall supraumbilically.  We induced carbon dioxide insufflation. Camera inspection revealed no injury.   I proceeded to continue with laparoscopic technique. I placed a 5 mm port in mid subcostal region, another 59mm port in the right flank near the anterior axillary line, and a 52mm port in the left subxiphoid region obliquely within the falciform ligament.  I turned attention to the right upper quadrant.  The gallbladder was severely inflamed.  The gallbladder fundus was elevated cephalad. I used cautery and blunt dissection to free the peritoneal coverings between the gallbladder and the liver on the posteriolateral and anteriomedial walls.   I used careful blunt and cautery dissection with a maryland dissector to help get a good critical view of the cystic artery and cystic duct. I did further dissection to free a few centimeters of the gallbladder off the liver bed to get a good critical view of the infundibulum and cystic duct. I mobilized the cystic artery.  I skeletonized the cystic duct.  After getting a good 360 view, I decided to perform a cholangiogram.  I placed a clip on the infundibulum.   I did a partial cystic duct-otomy and ensured patency. I placed a 5 F cholangiocatheter through  a puncture site at the right subcostal ridge of the abdominal wall and directed it into the cystic duct.  This was secured with a clip. We ran a cholangiogram with dilute radio-opaque contrast and continuous fluoroscopy.  Contrast flowed from a side branch  consistent with cystic duct cannulization. Contrast flowed up the common hepatic duct into the right and left intrahepatic chains out to secondary radicals. Contrast flowed down the common bile duct easily across the normal ampulla into the duodenum.  This was consistent with a normal cholangiogram.  I removed the cholangiocatheter.  I placed clips on the cystic duct x3.  I completed cystic duct transection.   I placed clips on the cystic artery x3 with 2 proximally.  I ligated the cystic artery using scissors. I freed the gallbladder from its remaining attachments to the liver. I ensured hemostasis on the gallbladder fossa of the liver and elsewhere. I inspected the rest of the abdomen & detected no injury nor bleeding elsewhere.  I irrigated the RUQ with normal saline.  I placed snow in the gallbladder fossa for added hemostasis.   I removed the gallbladder through the umbilical port site.  I closed the umbilical fascia using 0 Vicryl stitches.   I closed the skin using 4-0 vicryl stitch.  Sterile dressings were applied. The patient was extubated & arrived in the PACU in stable condition.  I had discussed postoperative care with the patient in the holding area.   I will discuss  operative findings and postoperative goals / instructions with the patient's family.  Instructions are written in the chart as well.

## 2014-09-19 NOTE — Anesthesia Preprocedure Evaluation (Signed)
Anesthesia Evaluation  Patient identified by MRN, date of birth, ID band Patient awake    Reviewed: Allergy & Precautions, H&P , NPO status , Patient's Chart, lab work & pertinent test results  Airway Mallampati: II  TM Distance: >3 FB Neck ROM: Full    Dental no notable dental hx.    Pulmonary neg pulmonary ROS,  breath sounds clear to auscultation  Pulmonary exam normal       Cardiovascular negative cardio ROS  Rhythm:Regular Rate:Normal     Neuro/Psych PSYCHIATRIC DISORDERS Depression negative neurological ROS     GI/Hepatic negative GI ROS, Neg liver ROS,   Endo/Other  negative endocrine ROS  Renal/GU negative Renal ROS  negative genitourinary   Musculoskeletal negative musculoskeletal ROS (+)   Abdominal   Peds negative pediatric ROS (+)  Hematology negative hematology ROS (+)   Anesthesia Other Findings   Reproductive/Obstetrics negative OB ROS                             Anesthesia Physical Anesthesia Plan  ASA: II  Anesthesia Plan: General   Post-op Pain Management:    Induction: Intravenous  Airway Management Planned: Oral ETT  Additional Equipment:   Intra-op Plan:   Post-operative Plan: Extubation in OR  Informed Consent: I have reviewed the patients History and Physical, chart, labs and discussed the procedure including the risks, benefits and alternatives for the proposed anesthesia with the patient or authorized representative who has indicated his/her understanding and acceptance.   Dental advisory given  Plan Discussed with: CRNA  Anesthesia Plan Comments:         Anesthesia Quick Evaluation

## 2014-09-19 NOTE — Progress Notes (Signed)
Pt returned from the OR alert and oriented. Incisions are clean and dry all four.

## 2014-09-19 NOTE — Progress Notes (Signed)
INITIAL NUTRITION ASSESSMENT  DOCUMENTATION CODES Per approved criteria  -Not Applicable   INTERVENTION:  Diet advancement per MD  Will continue to monitor  NUTRITION DIAGNOSIS: Inadequate oral intake related to abdominal pain as evidenced by PO intake <25% x 1 week.   Goal: Pt to meet >/= 90% of their estimated nutrition needs   Monitor:  Diet advancement, weight, labs, I/O's   Reason for Assessment: Pt identified as at nutrition risk on the Malnutrition Screen Tool   Admitting Dx: Acute cholecystitis  ASSESSMENT: 53 year old female with no significant past medical history was transferred from [facility] with right upper quadrant pain with fever of 104 degree Fahrenheit and chills. Patient reports having right flank pain on 10/26 for which she was seen in the ED and had noncontrast CT of the abdomen which showed tiny right kidney stone and gallstones.  11/1 S/P LAPAROSCOPIC CHOLECYSTECTOMY WITH INTRAOPERATIVE CHOLANGIOGRAM   Pt in PACU during visit. Spoke with family in room. Per family, pt has not eaten well x 1 week. Pt was having abdominal pain. The only food she has had are a few bites of a cheeseburger, sips of gingerale, broth and a few bites of jello. Family was unable to comment on any weight changes.  RD to follow-up to assess PO intake and if nutritional supplements are needed.  Labs reviewed.  Height: Ht Readings from Last 1 Encounters:  09/18/14 5\' 3"  (1.6 m)    Weight: Wt Readings from Last 1 Encounters:  09/18/14 159 lb 2.8 oz (72.2 kg)    Ideal Body Weight: 115 lb  % Ideal Body Weight: 138%  Wt Readings from Last 10 Encounters:  09/18/14 159 lb 2.8 oz (72.2 kg)    Usual Body Weight: unable to obtain at this time  % Usual Body Weight: NA  BMI:  Body mass index is 28.2 kg/(m^2).  Estimated Nutritional Needs: Kcal: 1800-1900 Protein: 85-95g Fluid: 1.8L/day  Skin: abdominal incisions  Diet Order: Diet clear liquid  EDUCATION  NEEDS: -Education not appropriate at this time   Intake/Output Summary (Last 24 hours) at 09/19/14 1510 Last data filed at 09/19/14 1431  Gross per 24 hour  Intake 3788.25 ml  Output   1950 ml  Net 1838.25 ml    Last BM: 10/28  Labs:   Recent Labs Lab 09/18/14 0524 09/19/14 0455  NA 140 137  K 3.6* 4.0  CL 101 100  CO2 27 24  BUN 12 9  CREATININE 0.98 0.86  CALCIUM 8.0* 8.3*  GLUCOSE 103* 88    CBG (last 3)  No results for input(s): GLUCAP in the last 72 hours.  Scheduled Meds: . ciprofloxacin  400 mg Intravenous Q12H  . HYDROmorphone        Continuous Infusions: . sodium chloride 1,000 mL (09/19/14 0736)    Past Medical History  Diagnosis Date  . Depression     Past Surgical History  Procedure Laterality Date  . Abdominal hysterectomy    . Orif finger / thumb fracture      Clayton Bibles, MS, RD, LDN Pager: [phone] After Hours Pager: [phone]

## 2014-09-19 NOTE — Progress Notes (Signed)
RUQ pain  Subjective: Pt presents from outside hospital with RUQ pain.  CT and RUQ US show gallstones.  She has elevated LFT's as well.  Her symptoms are rather atypical.    Objective: Vital signs in last 24 hours: Temp:  [98.3 F (36.8 C)-100 F (37.8 C)] 99 F (37.2 C) (11/01 0827) Pulse Rate:  [71-83] 83 (11/01 0827) Resp:  [16-18] 18 (11/01 0827) BP: (124-155)/(60-85) 148/85 mmHg (11/01 0827) SpO2:  [96 %-98 %] 98 % (11/01 0827) Last BM Date: 09/15/14 (Pt has BM q4-5 days)  Intake/Output from previous day: 10/31 0701 - 11/01 0700 In: 2542 [I.V.:2142; IV Piggyback:400] Out: 1000 [Urine:1000] Intake/Output this shift: Total I/O In: 546.3 [I.V.:346.3; IV Piggyback:200] Out: 1200 [Urine:1200]  General appearance: alert and cooperative GI: normal findings: soft, non-tender   Lab Results:  Results for orders placed or performed during the hospital encounter of 09/18/14 (from the past 24 hour(s))  CBC     Status: Abnormal   Collection Time: 09/19/14  4:55 AM  Result Value Ref Range   WBC 11.1 (H) 4.0 - 10.5 K/uL   RBC 4.28 3.87 - 5.11 MIL/uL   Hemoglobin 11.3 (L) 12.0 - 15.0 g/dL   HCT 35.2 (L) 36.0 - 46.0 %   MCV 82.2 78.0 - 100.0 fL   MCH 26.4 26.0 - 34.0 pg   MCHC 32.1 30.0 - 36.0 g/dL   RDW 13.6 11.5 - 15.5 %   Platelets 315 150 - 400 K/uL  Comprehensive metabolic panel     Status: Abnormal   Collection Time: 09/19/14  4:55 AM  Result Value Ref Range   Sodium 137 137 - 147 mEq/L   Potassium 4.0 3.7 - 5.3 mEq/L   Chloride 100 96 - 112 mEq/L   CO2 24 19 - 32 mEq/L   Glucose, Bld 88 70 - 99 mg/dL   BUN 9 6 - 23 mg/dL   Creatinine, Ser 0.86 0.50 - 1.10 mg/dL   Calcium 8.3 (L) 8.4 - 10.5 mg/dL   Total Protein 6.4 6.0 - 8.3 g/dL   Albumin 2.5 (L) 3.5 - 5.2 g/dL   AST 73 (H) 0 - 37 U/L   ALT 97 (H) 0 - 35 U/L   Alkaline Phosphatase 231 (H) 39 - 117 U/L   Total Bilirubin 0.5 0.3 - 1.2 mg/dL   GFR calc non Af Amer 75 (L) >90 mL/min   GFR calc Af Amer 87 (L) >90  mL/min   Anion gap 13 5 - 15  Surgical pcr screen     Status: None   Collection Time: 09/19/14  7:34 AM  Result Value Ref Range   MRSA, PCR NEGATIVE NEGATIVE   Staphylococcus aureus NEGATIVE NEGATIVE     Studies/Results Radiology     MEDS, Scheduled . ciprofloxacin  400 mg Intravenous Q12H     Assessment: Symptomatic cholelithiasis  Plan: OR today for cholecystectomy   LOS: 1 day    Rosario Adie, MD [facility], [place] [phone]   09/19/2014 11:46 AM

## 2014-09-19 NOTE — Progress Notes (Addendum)
Pt instructed how to use incentive spirometer and splint stomach when coughing. Husband at bedside.

## 2014-09-19 NOTE — Progress Notes (Signed)
ANTIBIOTIC CONSULT NOTE - INITIAL  Pharmacy Consult for Vancomycin Indication: bacteremia  No Known Allergies  Patient Measurements: Height: 5\' 3"  (160 cm) Weight: 159 lb 2.8 oz (72.2 kg) IBW/kg (Calculated) : 52.4  Vital Signs: Temp: 98.2 F (36.8 C) (11/01 1600) Temp Source: Oral (11/01 1600) BP: 151/79 mmHg (11/01 1600) Pulse Rate: 78 (11/01 1600) Intake/Output from previous day: 10/31 0701 - 11/01 0700 In: 2542 [I.V.:2142; IV Piggyback:400] Out: 1000 [Urine:1000] Intake/Output from this shift: Total I/O In: 1446.3 [I.V.:1246.3; IV Piggyback:200] Out: 1250 [Urine:1200; Blood:50]  Labs:  Recent Labs  09/18/14 0524 09/19/14 0455  WBC 10.1 11.1*  HGB 11.7* 11.3*  PLT 324 315  CREATININE 0.98 0.86   Estimated Creatinine Clearance: 71.2 mL/min (by C-G formula based on Cr of 0.86). No results for input(s): VANCOTROUGH, VANCOPEAK, VANCORANDOM, GENTTROUGH, GENTPEAK, GENTRANDOM, TOBRATROUGH, TOBRAPEAK, TOBRARND, AMIKACINPEAK, AMIKACINTROU, AMIKACIN in the last 72 hours.   Microbiology: Recent Results (from the past 720 hour(s))  Surgical pcr screen     Status: None   Collection Time: 09/19/14  7:34 AM  Result Value Ref Range Status   MRSA, PCR NEGATIVE NEGATIVE Final   Staphylococcus aureus NEGATIVE NEGATIVE Final    Comment:        The Xpert SA Assay (FDA approved for NASAL specimens in patients over 38 years of age), is one component of a comprehensive surveillance program.  Test performance has been validated by [employer] for patients greater than or equal to 18 year old. It is not intended to diagnose infection nor to guide or monitor treatment.     Medical History: Past Medical History  Diagnosis Date  . Depression     Assessment: 53 yoF with bacteremia possibly due to cholangitis. Blood cultures from [facility] ED sent on 10/30 growing gram positive cocci. Pharmacy consulted to dose Vancomycin for bacteremia.  11/1 >>Cipro (MD) >> 11/1 >>Vanco  >>   Tmax: AF WBCs: 11.1 Renal: SCr 0.86 CrCl 11ml/min  11/1 blood: sent  Goal of Therapy:  Vancomycin trough level 15-20 mcg/ml  Plan:  Start Vancomycin 750mg  IV Q8H Measure antibiotic drug levels at steady state Follow up culture results  Kizzie Furnish, PharmD Pager: [phone] 09/19/2014 4:16 PM

## 2014-09-19 NOTE — Progress Notes (Signed)
Pt returned from [place] and oriented. Four incision sites clean and dry on abdomen.

## 2014-09-19 NOTE — Anesthesia Postprocedure Evaluation (Signed)
  Anesthesia Post-op Note  Patient: Jamie Stephenson  Procedure(s) Performed: Procedure(s) (LRB): LAPAROSCOPIC CHOLECYSTECTOMY WITH INTRAOPERATIVE CHOLANGIOGRAM (N/A)  Patient Location: PACU  Anesthesia Type: General  Level of Consciousness: awake and alert   Airway and Oxygen Therapy: Patient Spontanous Breathing  Post-op Pain: mild  Post-op Assessment: Post-op Vital signs reviewed, Patient's Cardiovascular Status Stable, Respiratory Function Stable, Patent Airway and No signs of Nausea or vomiting  Last Vitals:  Filed Vitals:   09/19/14 1545  BP: 137/72  Pulse: 75  Temp: 36.9 C  Resp: 14    Post-op Vital Signs: stable   Complications: No apparent anesthesia complications

## 2014-09-19 NOTE — Progress Notes (Signed)
Patient ID: Jamie Stephenson, female   DOB: 1961-08-13, 53 y.o.   MRN: [id number]   Surgery has seen , and pt scheduled for lap chole this am. Please call if further GI assistance needed.  Agree with Ms. Genia Harold assessment and plan. Gatha Mayer, MD, [facility]

## 2014-09-20 DIAGNOSIS — R1084 Generalized abdominal pain: Secondary | ICD-10-CM

## 2014-09-20 DIAGNOSIS — K810 Acute cholecystitis: Secondary | ICD-10-CM

## 2014-09-20 DIAGNOSIS — R7881 Bacteremia: Secondary | ICD-10-CM

## 2014-09-20 LAB — COMPREHENSIVE METABOLIC PANEL
ALBUMIN: 2.5 g/dL — AB (ref 3.5–5.2)
ALT: 115 U/L — ABNORMAL HIGH (ref 0–35)
ANION GAP: 12 (ref 5–15)
AST: 93 U/L — ABNORMAL HIGH (ref 0–37)
Alkaline Phosphatase: 207 U/L — ABNORMAL HIGH (ref 39–117)
BILIRUBIN TOTAL: 0.3 mg/dL (ref 0.3–1.2)
BUN: 9 mg/dL (ref 6–23)
CHLORIDE: 98 meq/L (ref 96–112)
CO2: 27 meq/L (ref 19–32)
Calcium: 8.4 mg/dL (ref 8.4–10.5)
Creatinine, Ser: 0.74 mg/dL (ref 0.50–1.10)
GFR calc Af Amer: >90 mL/min (ref >90)
Glucose, Bld: 100 mg/dL — ABNORMAL HIGH (ref 70–99)
Potassium: 3.9 mEq/L (ref 3.7–5.3)
Sodium: 137 mEq/L (ref 137–147)
Total Protein: 6.3 g/dL (ref 6.0–8.3)

## 2014-09-20 MED ORDER — OXYCODONE-ACETAMINOPHEN 5-325 MG PO TABS
1.0000 | ORAL_TABLET | ORAL | Status: DC | PRN
  Administered 2014-09-20: 1 via ORAL
  Administered 2014-09-20: 2 via ORAL
  Filled 2014-09-20: qty 1
  Filled 2014-09-20: qty 2

## 2014-09-20 NOTE — Discharge Instructions (Signed)
[facility] ______[facility] °LAPAROSCOPIC SURGERY: POST OP INSTRUCTIONS °Always review your discharge instruction sheet given to you by the facility where your surgery was performed. °IF YOU HAVE DISABILITY OR FAMILY LEAVE FORMS, YOU MUST BRING THEM TO THE OFFICE FOR PROCESSING.   °DO NOT GIVE THEM TO YOUR DOCTOR. ° °1. A prescription for pain medication may be given to you upon discharge.  Take your pain medication as prescribed, if needed.  If narcotic pain medicine is not needed, then you may take acetaminophen (Tylenol) or ibuprofen (Advil) as needed. °2. Take your usually prescribed medications unless otherwise directed. °3. If you need a refill on your pain medication, please contact your pharmacy.  They will contact our office to request authorization. Prescriptions will not be filled after 5pm or on week-ends. °4. You should follow a light diet the first few days after arrival home, such as soup and crackers, etc.  Be sure to include lots of fluids daily. °5. Most patients will experience some swelling and bruising in the area of the incisions.  Ice packs will help.  Swelling and bruising can take several days to resolve.  °6. It is common to experience some constipation if taking pain medication after surgery.  Increasing fluid intake and taking a stool softener (such as Colace) will usually help or prevent this problem from occurring.  A mild laxative (Milk of Magnesia or Miralax) should be taken according to package instructions if there are no bowel movements after 48 hours. °7. Unless discharge instructions indicate otherwise, you may remove your bandages 24-48 hours after surgery, and you may shower at that time.  You may have steri-strips (small skin tapes) in place directly over the incision.  These strips should be left on the skin for 7-10 days.  If your surgeon used skin glue on the incision, you may shower in 24 hours.  The glue will flake off over the next 2-3 weeks.  Any sutures or  staples will be removed at the office during your follow-up visit. °8. ACTIVITIES:  You may resume regular (light) daily activities beginning the next day--such as daily self-care, walking, climbing stairs--gradually increasing activities as tolerated.  You may have sexual intercourse when it is comfortable.  Refrain from any heavy lifting or straining until approved by your doctor. °a. You may drive when you are no longer taking prescription pain medication, you can comfortably wear a seatbelt, and you can safely maneuver your car and apply brakes. °b. RETURN TO WORK:  __________________________________________________________ °9. You should see your doctor in the office for a follow-up appointment approximately 2-3 weeks after your surgery.  Make sure that you call for this appointment within a day or two after you arrive home to insure a convenient appointment time. °10. OTHER INSTRUCTIONS: __________________________________________________________________________________________________________________________ __________________________________________________________________________________________________________________________ °WHEN TO CALL YOUR DOCTOR: °1. Fever over 101.0 °2. Inability to urinate °3. Continued bleeding from incision. °4. Increased pain, redness, or drainage from the incision. °5. Increasing abdominal pain ° °The clinic staff is available to answer your questions during regular business hours.  Please don’t hesitate to call and ask to speak to one of the nurses for clinical concerns.  If you have a medical emergency, go to the nearest emergency room or call 911.  A surgeon from [facility] is always on call at the hospital. °[address], [address], [place], [place]  [address] ? [address] [address], [place], [place]   [address] °[phone] ? [phone] ? FAX [phone] °Web site:   [email or web address] °

## 2014-09-20 NOTE — Progress Notes (Signed)
CARE MANAGEMENT NOTE 09/20/2014  Patient:  Jamie Stephenson, Jamie Stephenson   Account Number:  [id number]  Date Initiated:  09/20/2014  Documentation initiated by:  [facility]  Subjective/Objective Assessment:   53 Y/O F ADMITTED W/CHOLELITHIASIS.     Action/Plan:   FROM HOME.   Anticipated DC Date:  09/23/2014   Anticipated DC Plan:  [place]  CM consult      Choice offered to / List presented to:             Status of service:  In process, will continue to follow Medicare Important Message given?   (If response is "NO", the following Medicare IM given date fields will be blank) Date Medicare IM given:   Medicare IM given by:   Date Additional Medicare IM given:   Additional Medicare IM given by:    Discharge Disposition:    Per UR Regulation:  Reviewed for med. necessity/level of care/duration of stay  If discussed at [place] of Stay Meetings, dates discussed:    Comments:  09/20/14 Natina Wiginton RN,BSN NCM [place]. WILL CONFIRM W/ADMITTING IN AM.

## 2014-09-20 NOTE — Progress Notes (Signed)
Patient ID: Jamie Stephenson, female   DOB: February 29, 1961, 53 y.o.   MRN: [id number] [facility] PROGRESS NOTE  PAOLINA KARWOWSKI UXL:[id number] DOB: 07-Sep-1961 DOA: 09/18/2014 PCP: Rory Percy, MD  Brief narrative: 53 year old female with no significant past medical history was transferred from [facility] 09/18/2014 with right upper quadrant pain associated with fever of 104 F. Patient reported having right flank pain on 10/26 for which she was seen in the ED and had noncontrast CT of the abdomen which showed small right kidney stone and gallstones. She was discharged home after some pain medications were given but returned back to the ED with fever and chills and right upper quadrant pain. Reportedly she was found to have a transaminitis and since they did not have any GI service available she was transferred to [facility] for further management. Pt underwent cholecystectomy 09/19/2014. Additionally, her blood cultures from [facility] were positive for gram positive cocci in chains.   Assessment/Plan:    Principal Problem: Cholecystitis/choledocholithiasis with ? Acute cholangitis  Patient with abnormal LFT's on admission: AST 101, ALT 106, A:P 231, normal bilirubin level.   Ultrasound abdomen showed nonspecific gallbladder wall thickening with cholelithiasis.   Seen by GI and general surgery. Laproscopic cholecystectomy with IOC done 09/19/2014. (normal cholangiogram noted).  Pt is on cipro 400 mg IV Q 12 hours considering possible bacteremia, leukocytosis, high fever on admission.   Tolerated diet well today.  Active Problems: Bacteremia possibly due to cholangitis / gram positive cocci  Blood cultures from [facility] ED sent on 10/30 growing gram-positive cocci. I called lab at [facility] at [phone] ext [phone] and final report still not available.   Blood cultures obtained on this admission. Follow up results to ensure clearance. She is on vancomycin until final report  available.   DVT Prophylaxis   SCD's bilaterally    Code Status: Full.  Family Communication:  plan of care discussed with the patient Disposition Plan: Home when stable.    IV Access:   Peripheral IV Procedures and diagnostic studies:   Dg Cholangiogram Operative 09/19/2014   Normal intraoperative cholangiogram.     Medical Consultants:   Surgery  Other Consultants:   None  Anti-Infectives:   Cipro 09/18/2014 --> Vancomycin 09/18/2014 -->   Leisa Lenz, MD  [facility] Pager [phone]  If 7PM-7AM, please contact night-coverage [email or web address] Password [email or web address] 09/20/2014, 5:20 PM   LOS: 2 days    HPI/Subjective: No acute overnight events.  Objective: Filed Vitals:   09/20/14 0034 09/20/14 0439 09/20/14 1012 09/20/14 1358  BP: 126/69 139/78 109/60 114/56  Pulse: 61 60 66 73  Temp: 97.6 F (36.4 C) 98 F (36.7 C) 98.2 F (36.8 C) 98.4 F (36.9 C)  TempSrc: Oral Oral Oral Oral  Resp: 16 16 18 18   Height:      Weight:      SpO2: 98% 100% 90% 95%    Intake/Output Summary (Last 24 hours) at 09/20/14 1720 Last data filed at 09/20/14 0600  Gross per 24 hour  Intake   2035 ml  Output    800 ml  Net   1235 ml    Exam:   General:  Pt is alert, follows commands appropriately, not in acute distress  Cardiovascular: Regular rate and rhythm, S1/S2, no murmurs  Respiratory: Clear to auscultation bilaterally, no wheezing, no crackles, no rhonchi  Abdomen: Soft, non tender, non distended, bowel sounds present  Extremities: No edema, pulses DP and PT palpable bilaterally  Neuro: Grossly nonfocal  Data Reviewed: Basic Metabolic Panel:  Recent Labs Lab 09/18/14 0524 09/19/14 0455 09/20/14 0520  NA 140 137 137  K 3.6* 4.0 3.9  CL 101 100 98  CO2 27 24 27   GLUCOSE 103* 88 100*  BUN 12 9 9   CREATININE 0.98 0.86 0.74  CALCIUM 8.0* 8.3* 8.4   Liver Function Tests:  Recent Labs Lab 09/18/14 0524 09/19/14 0455 09/20/14 0520  AST 101* 73*  93*  ALT 106* 97* 115*  ALKPHOS 231* 231* 207*  BILITOT 0.5 0.5 0.3  PROT 6.5 6.4 6.3  ALBUMIN 2.6* 2.5* 2.5*    Recent Labs Lab 09/18/14 0524  LIPASE 34   No results for input(s): AMMONIA in the last 168 hours. CBC:  Recent Labs Lab 09/18/14 0524 09/19/14 0455  WBC 10.1 11.1*  HGB 11.7* 11.3*  HCT 36.1 35.2*  MCV 82.8 82.2  PLT 324 315   Cardiac Enzymes: No results for input(s): CKTOTAL, CKMB, CKMBINDEX, TROPONINI in the last 168 hours. BNP: Invalid input(s): POCBNP CBG: No results for input(s): GLUCAP in the last 168 hours.  Recent Results (from the past 240 hour(s))  Culture, blood (routine x 2)     Status: None (Preliminary result)   Collection Time: 09/19/14  6:33 AM  Result Value Ref Range Status   Specimen Description BLOOD RIGHT ARM  Final   Special Requests BOTTLES DRAWN AEROBIC AND ANAEROBIC 10CC EACH  Final   Culture  Setup Time   Final    09/19/2014 17:58 Performed at [facility]    Culture   Final           BLOOD CULTURE RECEIVED NO GROWTH TO DATE CULTURE WILL BE HELD FOR 5 DAYS BEFORE ISSUING A FINAL NEGATIVE REPORT Performed at [facility]    Report Status PENDING  Incomplete  Culture, blood (routine x 2)     Status: None (Preliminary result)   Collection Time: 09/19/14  6:38 AM  Result Value Ref Range Status   Specimen Description BLOOD LEFT HAND  Final   Special Requests BOTTLES DRAWN AEROBIC ONLY 10CC  Final   Culture  Setup Time   Final    09/19/2014 17:59 Performed at [facility]    Culture   Final           BLOOD CULTURE RECEIVED NO GROWTH TO DATE CULTURE WILL BE HELD FOR 5 DAYS BEFORE ISSUING A FINAL NEGATIVE REPORT Performed at [facility]    Report Status PENDING  Incomplete  Surgical pcr screen     Status: None   Collection Time: 09/19/14  7:34 AM  Result Value Ref Range Status   MRSA, PCR NEGATIVE NEGATIVE Final   Staphylococcus aureus NEGATIVE NEGATIVE Final    Comment:        The Xpert  SA Assay (FDA approved for NASAL specimens in patients over 26 years of age), is one component of a comprehensive surveillance program.  Test performance has been validated by [employer] for patients greater than or equal to 69 year old. It is not intended to diagnose infection nor to guide or monitor treatment.      Scheduled Meds: . ciprofloxacin  400 mg Intravenous Q12H  . vancomycin  750 mg Intravenous Q8H   Continuous Infusions: . sodium chloride 75 mL/hr at 09/20/14 1022

## 2014-09-20 NOTE — Plan of Care (Signed)
Problem: Phase II Progression Outcomes Goal: Progress activity as tolerated unless otherwise ordered Outcome: Completed/Met Date Met:  09/20/14 Goal: Discharge plan established Outcome: Progressing Goal: Vital signs remain stable Outcome: Completed/Met Date Met:  09/20/14 Goal: Obtain order to discontinue catheter if appropriate Outcome: Not Applicable Date Met:  42/76/70

## 2014-09-20 NOTE — Progress Notes (Signed)
Patient ID: Jamie Stephenson, female   DOB: [birth year], 53 y.o.   MRN: [id number] 1 Day Post-Op  Subjective: Pt feels much better today.  Wants to go home.  She is tolerating clear liquids and is ready for solid diet.  Pain is well controlled.  She has not required any pain meds since 11:30pm last night.  Objective: Vital signs in last 24 hours: Temp:  [97.6 F (36.4 C)-99 F (37.2 C)] 98 F (36.7 C) (11/02 0439) Pulse Rate:  [60-83] 60 (11/02 0439) Resp:  [12-18] 16 (11/02 0439) BP: (125-151)/(67-85) 139/78 mmHg (11/02 0439) SpO2:  [90 %-100 %] 100 % (11/02 0439) Last BM Date: 09/19/14  Intake/Output from previous day: 11/01 0701 - 11/02 0700 In: 3481.3 [I.V.:2781.3; IV Piggyback:700] Out: 2050 [Urine:2000; Blood:50] Intake/Output this shift:    PE: Abd: soft, minimally tender, +BS, incisions c/d/i with dermabond Heart: regular  Lab Results:   Recent Labs  09/18/14 0524 09/19/14 0455  WBC 10.1 11.1*  HGB 11.7* 11.3*  HCT 36.1 35.2*  PLT 324 315   BMET  Recent Labs  09/19/14 0455 09/20/14 0520  NA 137 137  K 4.0 3.9  CL 100 98  CO2 24 27  GLUCOSE 88 100*  BUN 9 9  CREATININE 0.86 0.74  CALCIUM 8.3* 8.4   PT/INR No results for input(s): LABPROT, INR in the last 72 hours. CMP     Component Value Date/Time   NA 137 09/20/2014 0520   K 3.9 09/20/2014 0520   CL 98 09/20/2014 0520   CO2 27 09/20/2014 0520   GLUCOSE 100* 09/20/2014 0520   BUN 9 09/20/2014 0520   CREATININE 0.74 09/20/2014 0520   CALCIUM 8.4 09/20/2014 0520   PROT 6.3 09/20/2014 0520   ALBUMIN 2.5* 09/20/2014 0520   AST 93* 09/20/2014 0520   ALT 115* 09/20/2014 0520   ALKPHOS 207* 09/20/2014 0520   BILITOT 0.3 09/20/2014 0520   GFRNONAA >90 09/20/2014 0520   GFRAA >90 09/20/2014 0520   Lipase     Component Value Date/Time   LIPASE 34 09/18/2014 0524       Studies/Results: Dg Cholangiogram Operative  09/19/2014   CLINICAL DATA:  Cholecystectomy for cholelithiasis and  cholecystitis.  EXAM: INTRAOPERATIVE CHOLANGIOGRAM  TECHNIQUE: Cholangiographic images from the C-arm fluoroscopic device were submitted for interpretation post-operatively. Please see the procedural report for the amount of contrast and the fluoroscopy time utilized.  COMPARISON:  Ultrasound on 09/18/2014  FINDINGS: Intraoperative cholangiogram shows a normal opacified biliary tree without evidence of filling defect or biliary obstruction. Contrast enters the duodenum normally. No extravasation of contrast identified.  IMPRESSION: Normal intraoperative cholangiogram.   Electronically Signed   By: Aletta Edouard M.D.   On: 09/19/2014 15:31   US Abdomen Complete  09/18/2014   CLINICAL DATA:  Right upper quadrant pain. Three days. Abnormal liver function tests.  EXAM: ULTRASOUND ABDOMEN COMPLETE  COMPARISON:  09/13/2014  FINDINGS: Gallbladder: Multiple gallstones. Largest is 1.2 cm. Gallbladder wall is mildly thickened at 6 mm. No Murphy's sign.  Common bile duct: Diameter: 5 mm in caliber.  Liver: No focal lesion identified. Within normal limits in parenchymal echogenicity.  IVC: No abnormality visualized.  Pancreas: Visualized portion unremarkable.  Spleen: Size and appearance within normal limits.  Right Kidney: Length: 10.3 cm in length. Echogenicity within normal limits. No mass or hydronephrosis visualized.  Left Kidney: Length: 10.7 cm in length. Echogenicity within normal limits. No mass or hydronephrosis visualized.  Abdominal aorta: No aneurysm visualized.  Other  findings: None.  IMPRESSION: Cholelithiasis.  There is nonspecific gallbladder wall thickening. Correlate clinically as for the need for nuclear medicine imaging to evaluate for cystic duct patency. Please note that Percell Miller sign was negative.   Electronically Signed   By: Maryclare Bean M.D.   On: 09/18/2014 12:22    Anti-infectives: Anti-infectives    Start     Dose/Rate Route Frequency Ordered Stop   09/19/14 1700  vancomycin (VANCOCIN) IVPB  750 mg/150 ml premix     750 mg150 mL/hr over 60 Minutes Intravenous Every 8 hours 09/19/14 1614     09/18/14 1000  ciprofloxacin (CIPRO) IVPB 400 mg     400 mg200 mL/hr over 60 Minutes Intravenous Every 12 hours 09/18/14 0847         Assessment/Plan  1. POD 1, s/p lap chole with IOC for cholecystitis  Plan: 1. Patient feels really well this morning, just sore.  She is wanting to go home.  She needs to tolerate her low fat diet that I just put her on prior to dc home.  If she is doing well after lunch tolerating her diet and her pain is well controlled, she is surgically stable for dc home at that time. 2. All of her dc instructions have been filled out.   LOS: 2 days    Kariann Wecker E 09/20/2014, 8:20 AM Pager: [phone]

## 2014-09-20 NOTE — Plan of Care (Signed)
Problem: Phase I Progression Outcomes Goal: Pain controlled with appropriate interventions Outcome: Completed/Met Date Met:  09/20/14 Goal: OOB as tolerated unless otherwise ordered Outcome: Completed/Met Date Met:  09/20/14 Goal: Initial discharge plan identified Outcome: Completed/Met Date Met:  09/20/14 Goal: Voiding-avoid urinary catheter unless indicated Outcome: Completed/Met Date Met:  09/20/14

## 2014-09-21 ENCOUNTER — Encounter (HOSPITAL_COMMUNITY): Payer: Self-pay | Admitting: General Surgery

## 2014-09-21 LAB — VANCOMYCIN, TROUGH: Vancomycin Tr: 15.1 ug/mL (ref 10.0–20.0)

## 2014-09-21 MED ORDER — OXYCODONE-ACETAMINOPHEN 5-325 MG PO TABS: 1.0000 | ORAL_TABLET | ORAL | Status: DC | PRN

## 2014-09-21 MED ORDER — AMOXICILLIN-POT CLAVULANATE 875-125 MG PO TABS: 1.0000 | ORAL_TABLET | Freq: Two times a day (BID) | ORAL | Status: DC

## 2014-09-21 NOTE — Progress Notes (Signed)
ANTIBIOTIC CONSULT NOTE - FOLLOW UP  Pharmacy Consult for vancomycin Indication: bacteremia  No Known Allergies  Patient Measurements: Height: 5\' 3"  (160 cm) Weight: 159 lb 2.8 oz (72.2 kg) IBW/kg (Calculated) : 52.4 Adjusted Body Weight:   Vital Signs: Temp: 98.4 F (36.9 C) (11/02 2138) Temp Source: Oral (11/02 2138) BP: 127/65 mmHg (11/02 2138) Pulse Rate: 73 (11/02 2138) Intake/Output from previous day: 11/02 0701 - 11/03 0700 In: 240 [P.O.:240] Out: -  Intake/Output from this shift: Total I/O In: 240 [P.O.:240] Out: -   Labs:  Recent Labs  09/19/14 0455 09/20/14 0520  WBC 11.1*  --   HGB 11.3*  --   PLT 315  --   CREATININE 0.86 0.74   Estimated Creatinine Clearance: 76.5 mL/min (by C-G formula based on Cr of 0.74).  Recent Labs  09/21/14 0001  VANCOTROUGH 15.1     Microbiology: Recent Results (from the past 720 hour(s))  Culture, blood (routine x 2)     Status: None (Preliminary result)   Collection Time: 09/19/14  6:33 AM  Result Value Ref Range Status   Specimen Description BLOOD RIGHT ARM  Final   Special Requests BOTTLES DRAWN AEROBIC AND ANAEROBIC 10CC EACH  Final   Culture  Setup Time   Final    09/19/2014 17:58 Performed at [facility]    Culture   Final           BLOOD CULTURE RECEIVED NO GROWTH TO DATE CULTURE WILL BE HELD FOR 5 DAYS BEFORE ISSUING A FINAL NEGATIVE REPORT Performed at [facility]    Report Status PENDING  Incomplete  Culture, blood (routine x 2)     Status: None (Preliminary result)   Collection Time: 09/19/14  6:38 AM  Result Value Ref Range Status   Specimen Description BLOOD LEFT HAND  Final   Special Requests BOTTLES DRAWN AEROBIC ONLY 10CC  Final   Culture  Setup Time   Final    09/19/2014 17:59 Performed at [facility]    Culture   Final           BLOOD CULTURE RECEIVED NO GROWTH TO DATE CULTURE WILL BE HELD FOR 5 DAYS BEFORE ISSUING A FINAL NEGATIVE REPORT Performed at [facility]    Report Status PENDING  Incomplete  Surgical pcr screen     Status: None   Collection Time: 09/19/14  7:34 AM  Result Value Ref Range Status   MRSA, PCR NEGATIVE NEGATIVE Final   Staphylococcus aureus NEGATIVE NEGATIVE Final    Comment:        The Xpert SA Assay (FDA approved for NASAL specimens in patients over 36 years of age), is one component of a comprehensive surveillance program.  Test performance has been validated by [employer] for patients greater than or equal to 12 year old. It is not intended to diagnose infection nor to guide or monitor treatment.     Anti-infectives    Start     Dose/Rate Route Frequency Ordered Stop   09/19/14 1700  vancomycin (VANCOCIN) IVPB 750 mg/150 ml premix     750 mg150 mL/hr over 60 Minutes Intravenous Every 8 hours 09/19/14 1614     09/18/14 1000  ciprofloxacin (CIPRO) IVPB 400 mg     400 mg200 mL/hr over 60 Minutes Intravenous Every 12 hours 09/18/14 0847        Assessment: Patient with vancomycin level at goal.    Goal of Therapy:  Vancomycin trough level  15-20 mcg/ml  Plan:  Measure antibiotic drug levels at steady state Follow up culture results Continue with current dosing  Tyler Deis, Shea Stakes [place] 09/21/2014,5:55 AM

## 2014-09-21 NOTE — Discharge Summary (Signed)
Physician Discharge Summary  Jamie Stephenson:[id number] DOB: Apr 05, 1961 DOA: 09/18/2014  PCP: Rory Percy, MD  Admit date: 09/18/2014 Discharge date: 09/21/2014   Recommendations for Outpatient Follow-Up:   1. Patient had blood cultures drawn at [facility] which grew strep sanguinous, broadly sensitive. She will complete 2 weeks of Augmentin post discharge. Recommend follow-up surveillance cultures post treatment with antibiotics. 2. Follow-up final blood cultures drawn on 09/19/14.   Discharge Diagnosis:   Principal Problem:    Acute calculous cholecystitis Active Problems:    Cholelithiasis    Abdominal pain    Hypokalemia    Fever presenting with conditions classified elsewhere    Cholecystitis, acute    Bacteremia  Discharge Condition: Improved.  Diet recommendation: Regular.   History of Present Illness:   Jamie Stephenson is an 53 y.o. female with no significant PMH who was transferred from [facility] 09/18/14 to evaluate right upper quadrant pain and high fever. Initial laboratory values showed transaminitis and CT scan of abdomen showed cholelithiasis. Patient underwent cholecystectomy 09/19/14. Blood cultures done at [facility] were positive for strep sanguinous.  Hospital Course by Problem:   Principal Problem:   Acute calculous cholecystitis / cholelithiasis / abdominal pain  Status post laparoscopic cholecystectomy with IOC done 09/19/14.  Doing well postoperatively.  Active Problems:   Hypokalemia  Resolved with supplementation.    Fever secondary to cholecystitis, acute  Resolved with empiric antibiotics and cholecystectomy.    Strep sanguinous Bacteremia  Treated with Cipro in the hospital and will be discharged home on a 2 week course of Augmentin.    Medical Consultants:    Dr. Alphonsa Overall, Surgery.  Dr. Silvano Rusk, GI.   Discharge Exam:   Filed Vitals:   09/20/14 2138  BP: 127/65  Pulse: 73    Temp: 98.4 F (36.9 C)  Resp: 20   Filed Vitals:   09/20/14 1012 09/20/14 1358 09/20/14 1816 09/20/14 2138  BP: 109/60 114/56 139/72 127/65  Pulse: 66 73 81 73  Temp: 98.2 F (36.8 C) 98.4 F (36.9 C) 98.8 F (37.1 C) 98.4 F (36.9 C)  TempSrc: Oral Oral Oral Oral  Resp: 18 18 20 20   Height:      Weight:      SpO2: 90% 95% 97% 93%    Gen:  NAD Cardiovascular:  RRR, No M/R/G Respiratory: Lungs CTAB Gastrointestinal: Abdomen soft, NT/ND with normal active bowel sounds. Extremities: No C/E/C   The results of significant diagnostics from this hospitalization (including imaging, microbiology, ancillary and laboratory) are listed below for reference.     Procedures and Diagnostic Studies:   Laparoscopic cholecystectomy and Dg Cholangiogram Operative 09/19/2014: Normal intraoperative cholangiogram.     US Abdomen Complete 09/18/2014: Cholelithiasis.  There is nonspecific gallbladder wall thickening. Correlate clinically as for the need for nuclear medicine imaging to evaluate for cystic duct patency. Please note that Percell Miller sign was negative.     Labs:   Basic Metabolic Panel:  Recent Labs Lab 09/18/14 0524 09/19/14 0455 09/20/14 0520  NA 140 137 137  K 3.6* 4.0 3.9  CL 101 100 98  CO2 27 24 27   GLUCOSE 103* 88 100*  BUN 12 9 9   CREATININE 0.98 0.86 0.74  CALCIUM 8.0* 8.3* 8.4   GFR Estimated Creatinine Clearance: 76.5 mL/min (by C-G formula based on Cr of 0.74). Liver Function Tests:  Recent Labs Lab 09/18/14 0524 09/19/14 0455 09/20/14 0520  AST 101* 73* 93*  ALT 106* 97* 115*  ALKPHOS  231* 231* 207*  BILITOT 0.5 0.5 0.3  PROT 6.5 6.4 6.3  ALBUMIN 2.6* 2.5* 2.5*    Recent Labs Lab 09/18/14 0524  LIPASE 34   CBC:  Recent Labs Lab 09/18/14 0524 09/19/14 0455  WBC 10.1 11.1*  HGB 11.7* 11.3*  HCT 36.1 35.2*  MCV 82.8 82.2  PLT 324 315   Microbiology Recent Results (from the past 240 hour(s))  Culture, blood (routine x 2)     Status:  None (Preliminary result)   Collection Time: 09/19/14  6:33 AM  Result Value Ref Range Status   Specimen Description BLOOD RIGHT ARM  Final   Special Requests BOTTLES DRAWN AEROBIC AND ANAEROBIC 10CC EACH  Final   Culture  Setup Time   Final    09/19/2014 17:58 Performed at [facility]    Culture   Final           BLOOD CULTURE RECEIVED NO GROWTH TO DATE CULTURE WILL BE HELD FOR 5 DAYS BEFORE ISSUING A FINAL NEGATIVE REPORT Performed at [facility]    Report Status PENDING  Incomplete  Culture, blood (routine x 2)     Status: None (Preliminary result)   Collection Time: 09/19/14  6:38 AM  Result Value Ref Range Status   Specimen Description BLOOD LEFT HAND  Final   Special Requests BOTTLES DRAWN AEROBIC ONLY 10CC  Final   Culture  Setup Time   Final    09/19/2014 17:59 Performed at [facility]    Culture   Final           BLOOD CULTURE RECEIVED NO GROWTH TO DATE CULTURE WILL BE HELD FOR 5 DAYS BEFORE ISSUING A FINAL NEGATIVE REPORT Performed at [facility]    Report Status PENDING  Incomplete  Surgical pcr screen     Status: None   Collection Time: 09/19/14  7:34 AM  Result Value Ref Range Status   MRSA, PCR NEGATIVE NEGATIVE Final   Staphylococcus aureus NEGATIVE NEGATIVE Final    Comment:        The Xpert SA Assay (FDA approved for NASAL specimens in patients over 52 years of age), is one component of a comprehensive surveillance program.  Test performance has been validated by [employer] for patients greater than or equal to 66 year old. It is not intended to diagnose infection nor to guide or monitor treatment.      Discharge Instructions:       Discharge Instructions    Call MD for:  persistant nausea and vomiting    Complete by:  As directed      Call MD for:  severe uncontrolled pain    Complete by:  As directed      Call MD for:  temperature >100.4    Complete by:  As directed      Diet general    Complete by:   As directed      Discharge instructions    Complete by:  As directed   You were cared for by Dr. Jacquelynn Cree  (a hospitalist) during your hospital stay. If you have any questions about your discharge medications or the care you received while you were in the hospital after you are discharged, you can call the unit and ask to speak with the hospitalist on call if the hospitalist that took care of you is not available. Once you are discharged, your primary care physician will handle any further medical issues. Please note that NO  REFILLS for any discharge medications will be authorized once you are discharged, as it is imperative that you return to your primary care physician (or establish a relationship with a primary care physician if you do not have one) for your aftercare needs so that they can reassess your need for medications and monitor your lab values.  Any outstanding tests can be reviewed by your PCP at your follow up visit.  It is also important to review any medicine changes with your PCP.  Please bring these d/c instructions with you to your next visit so your physician can review these changes with you.  If you do not have a primary care physician, you can call [phone] for a physician referral.  It is highly recommended that you obtain a PCP for hospital follow up.     Increase activity slowly    Complete by:  As directed             Medication List    TAKE these medications        amoxicillin-clavulanate 875-125 MG per tablet  Commonly known as:  AUGMENTIN  Take 1 tablet by mouth 2 (two) times daily.     buPROPion 300 MG 24 hr tablet  Commonly known as:  WELLBUTRIN XL  Take 300 mg by mouth daily.     estrogen-methylTESTOSTERone 0.625-1.25 MG per tablet  Take 1 tablet by mouth daily.     naproxen sodium 220 MG tablet  Commonly known as:  ANAPROX  Take 220 mg by mouth daily.     oxyCODONE-acetaminophen 5-325 MG per tablet  Commonly known as:  PERCOCET/ROXICET  Take  1-2 tablets by mouth every 4 (four) hours as needed for moderate pain.     vitamin B-6 25 MG tablet  Commonly known as:  pyridOXINE  Take 25 mg by mouth daily.       Follow-up Information    Follow up with [facility] On 10/12/2014.   Why:  1:30pm, arrive no later than 1:00pm for paperwork   Contact information:   [address] [place]   [place] [address] [phone]        Time coordinating discharge: 35 minutes.  Signed:  RAMA,CHRISTINA  Pager [phone] [facility] 09/21/2014, 2:56 PM

## 2014-09-27 LAB — CULTURE, BLOOD (ROUTINE X 2)
CULTURE: NO GROWTH
Culture: NO GROWTH

## 2014-12-22 ENCOUNTER — Ambulatory Visit (INDEPENDENT_AMBULATORY_CARE_PROVIDER_SITE_OTHER): Payer: Federal, State, Local not specified - PPO | Admitting: Neurology

## 2014-12-22 ENCOUNTER — Encounter: Payer: Self-pay | Admitting: Neurology

## 2014-12-22 VITALS — BP 155/92 | HR 69

## 2014-12-22 DIAGNOSIS — R413 Other amnesia: Secondary | ICD-10-CM

## 2014-12-22 HISTORY — DX: Other amnesia: R41.3

## 2014-12-22 NOTE — Patient Instructions (Signed)
  We will undertake a workup for memory issues, check MRI of the brain, blood work today, possibly an EEG in the future. We will follow-up in the next several months and monitor the memory issues.

## 2014-12-22 NOTE — Progress Notes (Signed)
Reason for visit: Memory disturbance  Jamie Stephenson is a 54 y.o. female  History of present illness:  Jamie Stephenson is a 56 year old right-handed white female with a history of some mild issues with memory dating back 10-12 years. The patient indicates that she has always had some difficulty with concentration and focusing, and she believes that she may have ADD. The patient has had some increasing problems with short-term and long-term memory and she cannot recall events that occurred years ago, and she has difficulty remembering things that occurred only a few weeks ago. She will repeat herself at times, she will misplace things about the house at times. She denies any significant issues with driving, and she is able to keep up with her medications and appointments. She is able to cook without difficulty. She denies any fatigue during the day, excessive daytime drowsiness, she does have some issues with anxiety and depression, but she indicates that this is not a significant problem for her. She denies any numbness or weakness on the face, arms, or legs. She denies any severe balance problems or difficulty controlling the bowels or the bladder with exception that there is some stress incontinence of the bladder. She has a history of seizures as a child, she went off of seizure medications as a young adult. Both her parents had seizures as well. She has had 2 episodes of sharp pains in the right temporal area asking about a minute that have occurred within the last month. Otherwise, she does not have headaches. She is sent to this office for an evaluation.  Past Medical History  Diagnosis Date  . Depression   . Memory disorder 12/22/2014  . History of seizure   . Stress incontinence, female     Past Surgical History  Procedure Laterality Date  . Abdominal hysterectomy    . Orif finger / thumb fracture    . Cholecystectomy N/A 09/19/2014    Procedure: LAPAROSCOPIC CHOLECYSTECTOMY WITH  INTRAOPERATIVE CHOLANGIOGRAM;  Surgeon: Leighton Ruff, MD;  Location: [facility];  Service: General;  Laterality: N/A;  . Appendectomy      Family History  Problem Relation Age of Onset  . Seizures Father   . Seizures Mother   . CAD Mother   . Hypertension Mother   . Heart disease Brother     Social history:  reports that she has never smoked. She has never used smokeless tobacco. She reports that she does not drink alcohol or use illicit drugs.  Medications:  Current Outpatient Prescriptions on File Prior to Visit  Medication Sig Dispense Refill  . buPROPion (WELLBUTRIN XL) 300 MG 24 hr tablet Take 300 mg by mouth daily.    Marland Kitchen estrogen-methylTESTOSTERone 0.625-1.25 MG per tablet Take 1 tablet by mouth daily.    . naproxen sodium (ANAPROX) 220 MG tablet Take 220 mg by mouth daily.    . vitamin B-6 (PYRIDOXINE) 25 MG tablet Take 25 mg by mouth daily.     No current facility-administered medications on file prior to visit.     No Known Allergies  ROS:  Out of a complete 14 system review of symptoms, the patient complains only of the following symptoms, and all other reviewed systems are negative.  Memory loss Headache  Blood pressure 155/92, pulse 69.  Physical Exam  General: The patient is alert and cooperative at the time of the examination. The patient is minimally obese.  Eyes: Pupils are equal, round, and reactive to light. Discs are flat  bilaterally.  Neck: The neck is supple, no carotid bruits are noted.  Respiratory: The respiratory examination is clear.  Cardiovascular: The cardiovascular examination reveals a regular rate and rhythm, no obvious murmurs or rubs are noted.  Skin: Extremities are without significant edema.  Neurologic Exam  Mental status: The patient is alert and oriented x 3 at the time of the examination. The patient has apparent normal recent and remote memory, with an apparently normal attention span and concentration ability. Mini-Mental  Status Examination done today shows a total score of 30/30.  Cranial nerves: Facial symmetry is present. There is good sensation of the face to pinprick and soft touch bilaterally. The strength of the facial muscles and the muscles to head turning and shoulder shrug are normal bilaterally. Speech is well enunciated, no aphasia or dysarthria is noted. Extraocular movements are full. Visual fields are full. The tongue is midline, and the patient has symmetric elevation of the soft palate. No obvious hearing deficits are noted.  Motor: The motor testing reveals 5 over 5 strength of all 4 extremities. Good symmetric motor tone is noted throughout.  Sensory: Sensory testing is intact to pinprick, soft touch, vibration sensation, and position sense on all 4 extremities. No evidence of extinction is noted.  Coordination: Cerebellar testing reveals good finger-nose-finger and heel-to-shin bilaterally.  Gait and station: Gait is normal. Tandem gait is normal. Romberg is negative. No drift is seen.  Reflexes: Deep tendon reflexes are symmetric and normal bilaterally. Toes are downgoing bilaterally.   Assessment/Plan:  1. Minimum cognitive impairment  2. History of anxiety and depression  3. History of seizures, off medications  The patient gives a 10 or 12 year history of some problems with memory. The patient may have an underlying issue with ADD. The patient will be set up for a workup to include MRI of the brain, and blood work. If the studies are unremarkable, we may consider an EEG study given the family and personal history of seizures. If the studies are unremarkable, a brief trial on Adderall may be warranted. The patient does not give a history that would suggest a sleep disorder or significant depression as an etiology for her memory problems. She will follow-up in about 4 months.  Jamie Alexanders MD 12/23/2014 9:48 PM  [facility] [address] [place],  [address]  Phone [phone] Fax [phone]

## 2014-12-23 ENCOUNTER — Encounter: Payer: Self-pay | Admitting: Neurology

## 2014-12-23 LAB — SPECIMEN STATUS REPORT

## 2014-12-24 LAB — TSH: TSH: 1.06 u[IU]/mL (ref 0.450–4.500)

## 2014-12-24 LAB — RPR: RPR: NONREACTIVE

## 2014-12-24 LAB — SEDIMENTATION RATE: Sed Rate: 20 mm/hr (ref 0–40)

## 2014-12-24 LAB — VITAMIN B12: Vitamin B-12: 288 pg/mL (ref 211–946)

## 2014-12-24 LAB — COPPER, SERUM: COPPER: 136 ug/dL (ref 72–166)

## 2014-12-24 LAB — HIV ANTIBODY (ROUTINE TESTING W REFLEX): HIV Screen 4th Generation wRfx: NONREACTIVE

## 2014-12-24 NOTE — Progress Notes (Signed)
Quick Note:  Called patient and relayed normal labs Patient understood. ______

## 2014-12-29 ENCOUNTER — Telehealth: Payer: Self-pay | Admitting: Neurology

## 2014-12-29 NOTE — Telephone Encounter (Signed)
MRI order was faxed and confirmation received.

## 2014-12-29 NOTE — Telephone Encounter (Signed)
Jamie Stephenson with [facility] @ [phone], questioning if there's additional protocols or requirements for MRI Brain .  Patient is scheduled for MRI on 12/30/14 @ 12:00 at [facility].  Jamie Stephenson requesting MRI order faxed to her @ [phone] or directly to facility @ [phone].  Please call and advise.

## 2014-12-30 DIAGNOSIS — R413 Other amnesia: Secondary | ICD-10-CM

## 2014-12-30 NOTE — Telephone Encounter (Signed)
I have also tried to reach the number listed but did not get through.

## 2015-01-06 ENCOUNTER — Telehealth: Payer: Self-pay | Admitting: Neurology

## 2015-01-06 NOTE — Telephone Encounter (Signed)
Patient requesting MRI results.  Please call and advise. °

## 2015-01-06 NOTE — Telephone Encounter (Signed)
I called the patient. I have not seen the CD, if we cannot locate it in the office, we will need to call [facility] to get another report sent over.

## 2015-01-06 NOTE — Telephone Encounter (Signed)
Left message that we did not have the results yet.  I then called [facility] and they said that the CD has been sent over and was signed as received by the front desk.  I relayed that we will call when read.

## 2015-01-10 NOTE — Telephone Encounter (Signed)
Spoke to the patient and she had the MRI done at [facility] on [address] in [place].  I have called [facility] and requested that they send another CD of the MRI since we cannot locate the one that was delivered and signed for.

## 2015-01-11 ENCOUNTER — Other Ambulatory Visit: Payer: Self-pay | Admitting: Diagnostic Neuroimaging

## 2015-01-11 DIAGNOSIS — R413 Other amnesia: Secondary | ICD-10-CM

## 2015-01-11 NOTE — Telephone Encounter (Signed)
Another copy of the CD, MRI has been received and given to Dr. Leta Baptist to read.

## 2015-01-12 ENCOUNTER — Telehealth: Payer: Self-pay | Admitting: Neurology

## 2015-01-12 DIAGNOSIS — R413 Other amnesia: Secondary | ICD-10-CM

## 2015-01-12 DIAGNOSIS — R9089 Other abnormal findings on diagnostic imaging of central nervous system: Secondary | ICD-10-CM
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
## 2015-01-18 ENCOUNTER — Ambulatory Visit (INDEPENDENT_AMBULATORY_CARE_PROVIDER_SITE_OTHER): Payer: Federal, State, Local not specified - PPO | Admitting: Neurology

## 2015-01-18 ENCOUNTER — Telehealth: Payer: Self-pay | Admitting: Neurology

## 2015-01-18 DIAGNOSIS — R930 Abnormal findings on diagnostic imaging of skull and head, not elsewhere classified: Secondary | ICD-10-CM

## 2015-01-18 DIAGNOSIS — R413 Other amnesia: Secondary | ICD-10-CM

## 2015-01-18 DIAGNOSIS — R9089 Other abnormal findings on diagnostic imaging of central nervous system: Secondary | ICD-10-CM

## 2015-01-18 NOTE — Telephone Encounter (Signed)
EEG done was normal, the results were released on my chart.

## 2015-01-18 NOTE — Procedures (Signed)
    History:  Jamie Stephenson is a 54 year old patient with a personal history of seizures and a family history of seizures. The patient reports a developing history of memory issues over the last 10-12 years. The patient is being evaluated for possible subclinical epilepsy.  This is a routine EEG. No skull defects are noted. Medications include Wellbutrin, Naprosyn, Valtrex, Vesicare, and vitamin B6.   EEG classification: Normal awake  Description of the recording: The background rhythms of this recording consists of a fairly well modulated medium amplitude alpha rhythm of 10 Hz that is reactive to eye opening and closure. As the record progresses, the patient appears to remain in the waking state throughout the recording. Photic stimulation was performed, resulting in a bilateral and symmetric photic driving response. Hyperventilation was also performed, resulting in a minimal buildup of the background rhythm activities without significant slowing seen. At no time during the recording does there appear to be evidence of spike or spike wave discharges or evidence of focal slowing. EKG monitor shows no evidence of cardiac rhythm abnormalities with a heart rate of 72.  Impression: This is a normal EEG recording in the waking state. No evidence of ictal or interictal discharges are seen.

## 2015-01-21 ENCOUNTER — Telehealth: Payer: Self-pay | Admitting: Neurology

## 2015-01-21 NOTE — Telephone Encounter (Signed)
Patient calling for EEG results.  Please call and advise. °

## 2015-01-21 NOTE — Telephone Encounter (Signed)
Spoke to patient and relayed normal EEG results, per Dr. Jannifer Franklin and that the results were released to my chart.  She would like to speak to the doctor to discuss where she goes from here, her next appointment is not until July.

## 2015-01-22 MED ORDER — AMPHETAMINE-DEXTROAMPHET ER 20 MG PO CP24: 20.0000 mg | ORAL_CAPSULE | Freq: Every day | ORAL | Status: DC

## 2015-01-22 NOTE — Telephone Encounter (Signed)
I called patient. The EEG study was normal. I will give the patient trial on Adderall to see if this improves her symptoms. She will contact me if she is still not doing well, we will follow the memory and concentration problems over time.

## 2015-02-22 ENCOUNTER — Other Ambulatory Visit: Payer: Self-pay | Admitting: Neurology

## 2015-02-22 MED ORDER — AMPHETAMINE-DEXTROAMPHET ER 20 MG PO CP24: 20.0000 mg | ORAL_CAPSULE | Freq: Every day | ORAL | Status: AC

## 2015-02-22 NOTE — Telephone Encounter (Signed)
Pt is calling requesting a written Rx for amphetamine-dextroamphetamine (ADDERALL XR) 20 MG 24 hr capsule. Please call when for pick up.

## 2015-02-22 NOTE — Telephone Encounter (Signed)
Request entered, forwarded to provider for approval.  

## 2015-02-23 ENCOUNTER — Telehealth: Payer: Self-pay

## 2015-02-23 NOTE — Telephone Encounter (Signed)
Called patient and informed Rx ready for pick up at front desk. Patient verbalized understanding.  

## 2015-05-20 ENCOUNTER — Ambulatory Visit: Payer: Federal, State, Local not specified - PPO | Admitting: Neurology

## 2016-06-27 IMAGING — US US ABDOMEN COMPLETE
1 series · 14 of 25 positions shown · non-contrast
Comparison: 09/13/2014

CLINICAL DATA: Right upper quadrant pain. Three days. Abnormal
liver function tests.

EXAM:
ULTRASOUND ABDOMEN COMPLETE

[Series 1: us abdomen complete · 0.22mm/px · 14 of 65 slices shown]
[im 1/65]
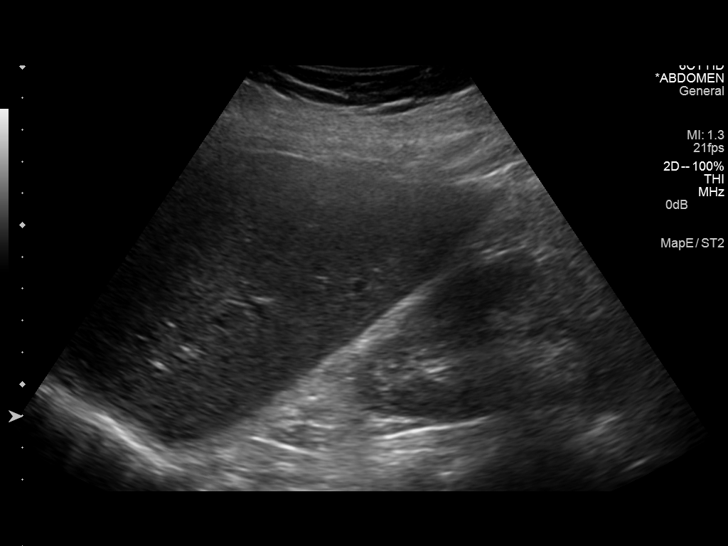
[im 6/65]
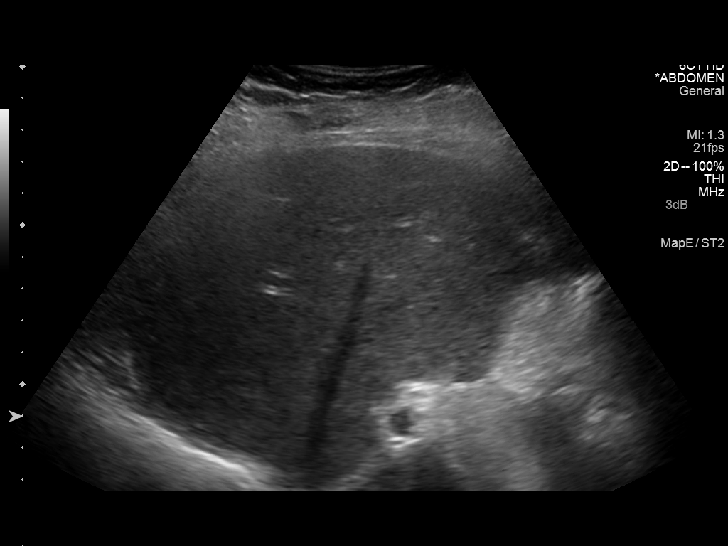
[im 11/65]
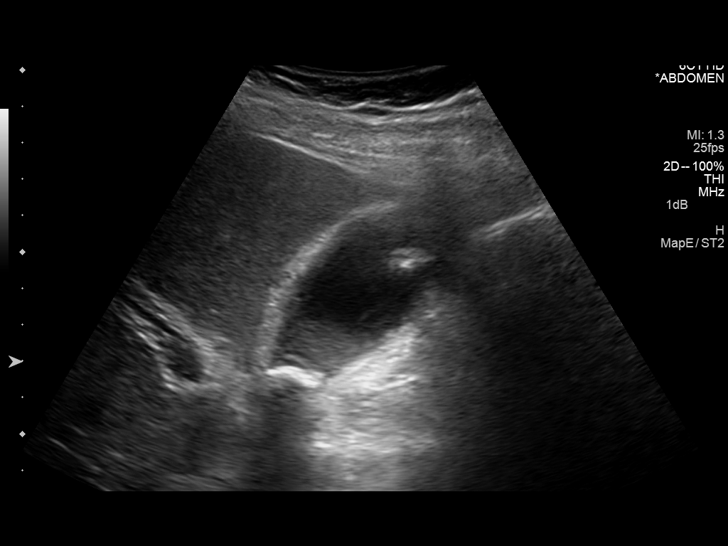
[im 17/65]
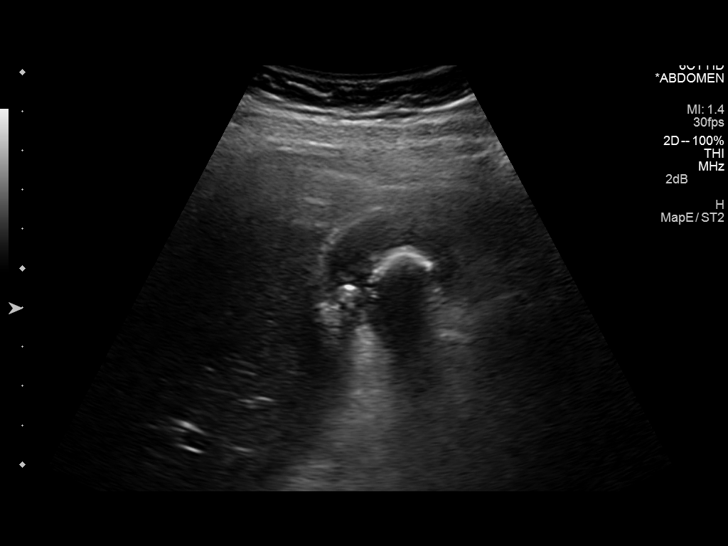
[im 22/65]
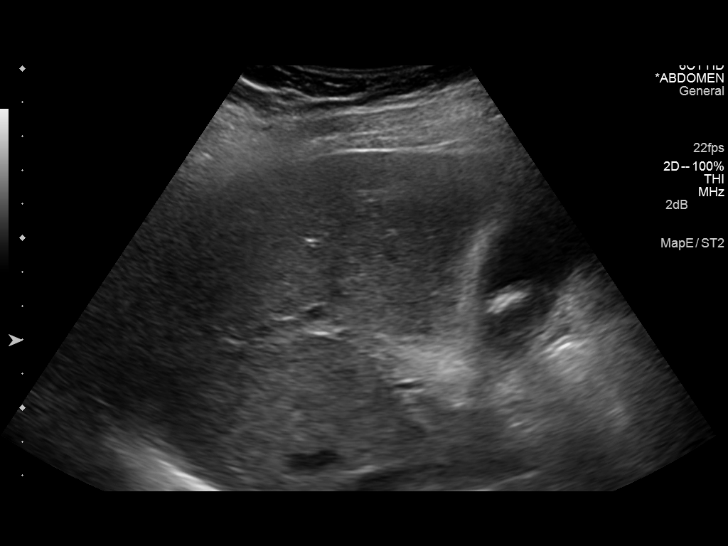
[im 25/65]
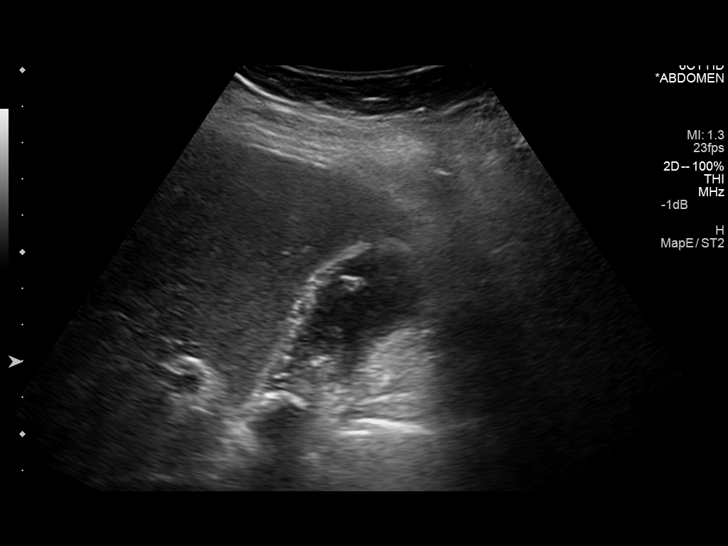
[im 30/65]
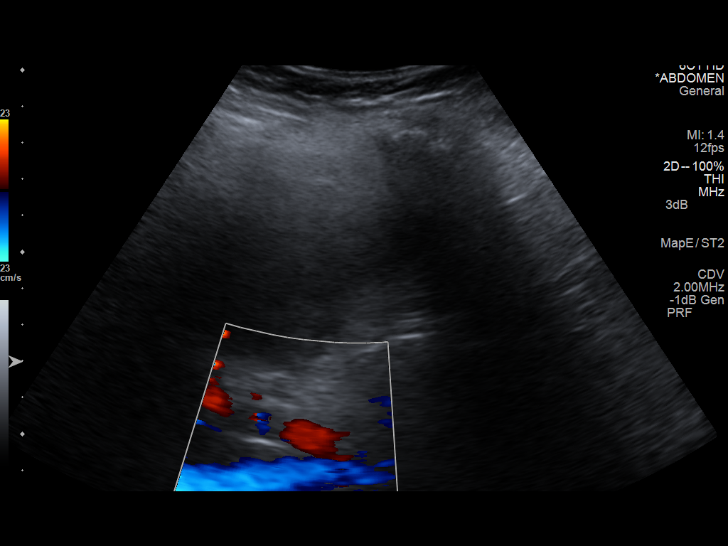
[im 35/65]
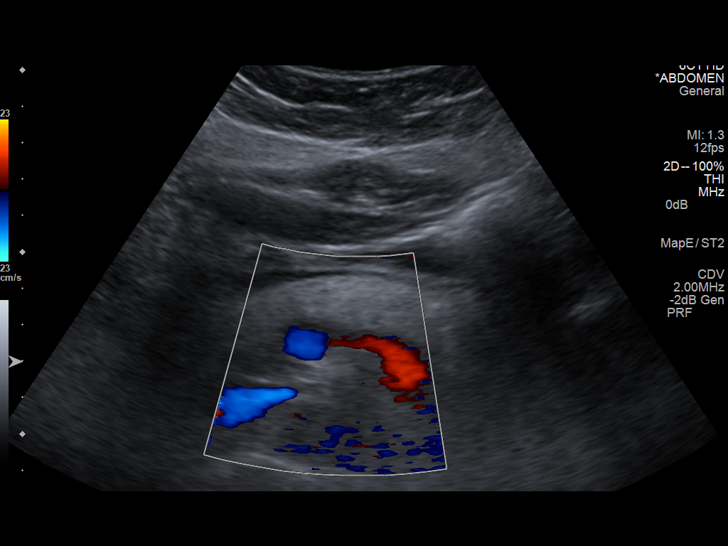
[im 41/65]
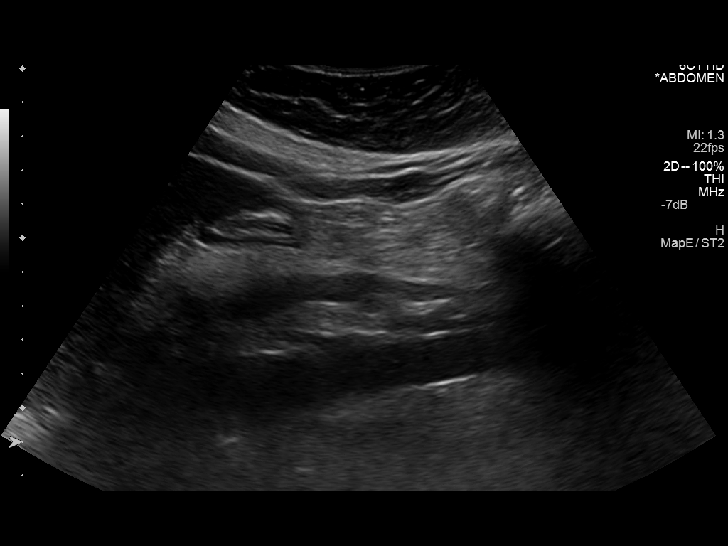
[im 43/65]
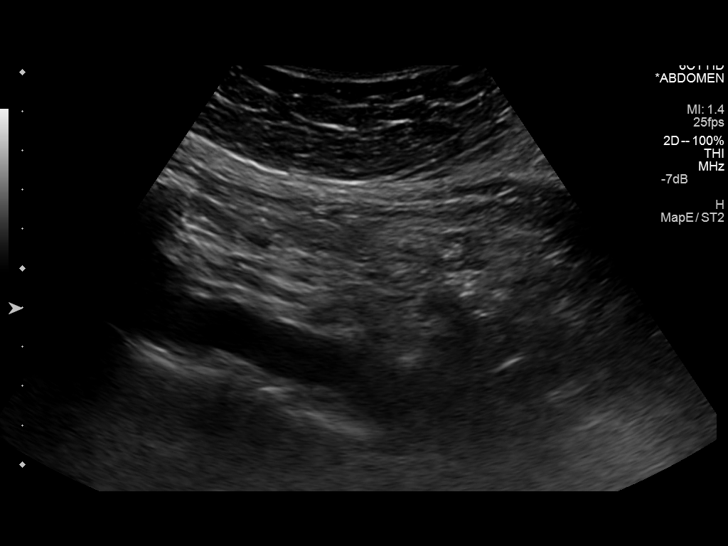
[im 49/65]
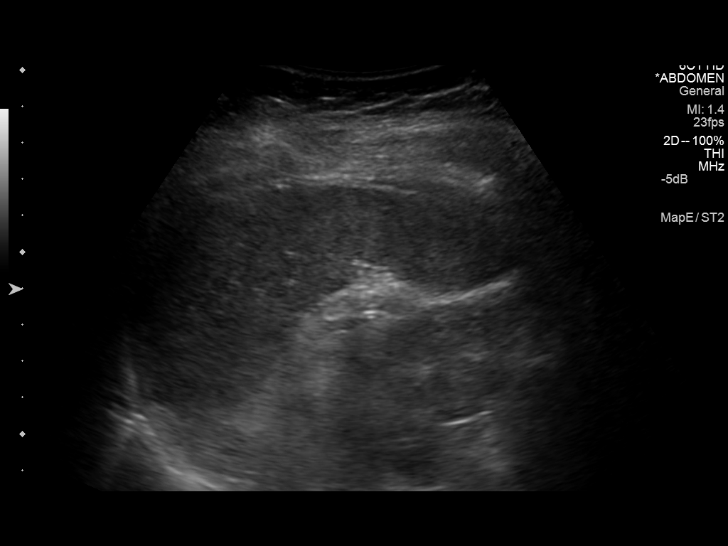
[im 54/65]
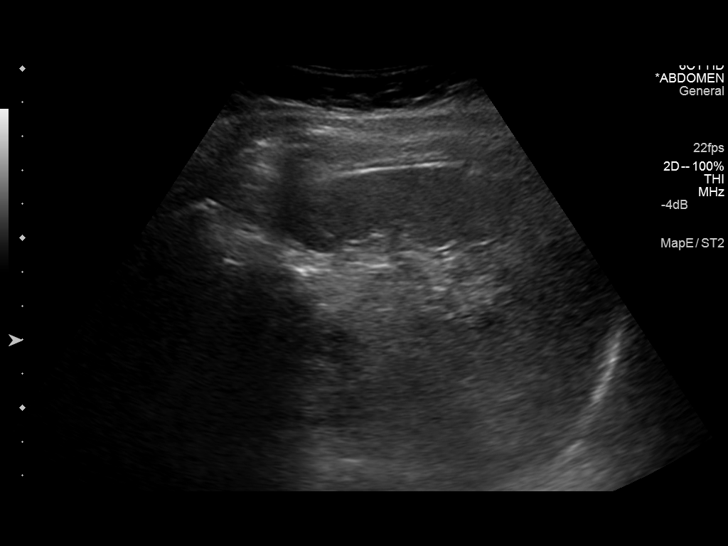
[im 59/65]
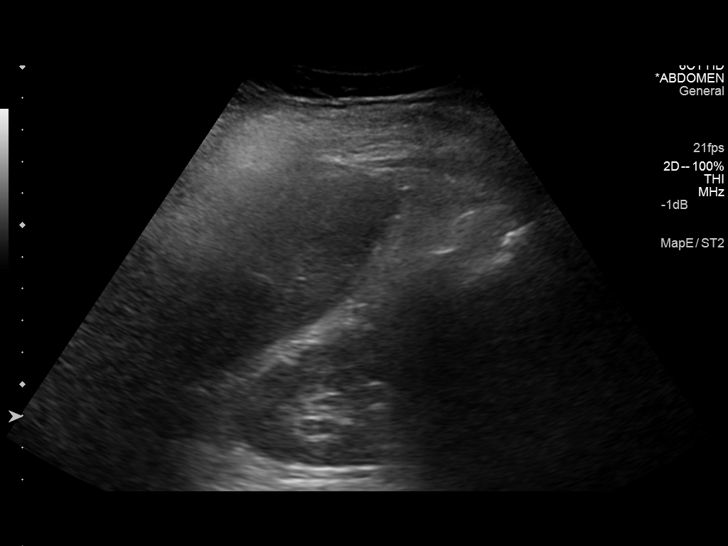
[im 65/65]
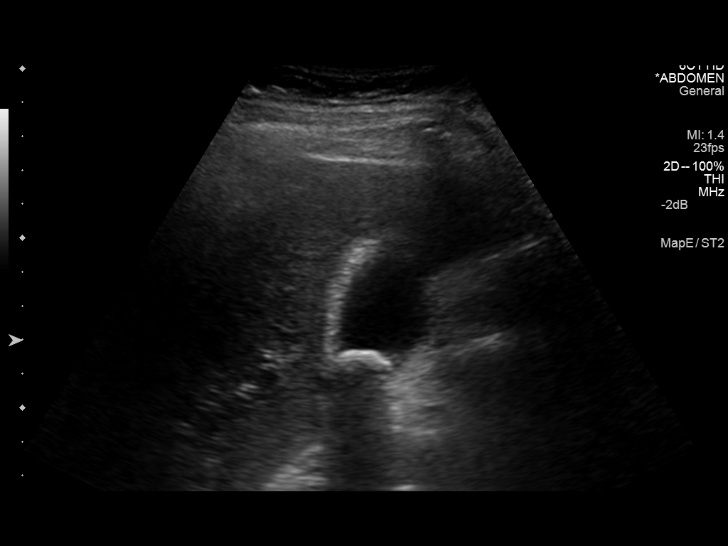

[14 of 25 positions shown; findings below may reference images not displayed]

FINDINGS: Gallbladder: Multiple gallstones. Largest is 1.2 cm. Gallbladder
wall is mildly thickened at 6 mm. No Murphy's sign.

Common bile duct: Diameter: 5 mm in caliber.

Liver: No focal lesion identified. Within normal limits in
parenchymal echogenicity.

IVC: No abnormality visualized.

Pancreas: Visualized portion unremarkable.

Spleen: Size and appearance within normal limits.

Right Kidney: Length: 10.3 cm in length. Echogenicity within normal
limits. No mass or hydronephrosis visualized.

Left Kidney: Length: 10.7 cm in length. Echogenicity within normal
limits. No mass or hydronephrosis visualized.

Abdominal aorta: No aneurysm visualized.

Other findings: None.
IMPRESSION: Cholelithiasis.

There is nonspecific gallbladder wall thickening. Correlate
clinically as for the need for nuclear medicine imaging to evaluate
for cystic duct patency. Please note that Murphy sign was negative.

## 2016-06-28 IMAGING — RF DG CHOLANGIOGRAM OPERATIVE
1 series · 4 of 4 positions shown · non-contrast
Comparison: Ultrasound on 09/18/2014

CLINICAL DATA: Cholecystectomy for cholelithiasis and
cholecystitis.

EXAM:
INTRAOPERATIVE CHOLANGIOGRAM
TECHNIQUE: Cholangiographic images from the C-arm fluoroscopic device were
submitted for interpretation post-operatively. Please see the
procedural report for the amount of contrast and the fluoroscopy
time utilized.

[Series 1: run · 4 of 49 frames shown]
[frame 8/49]
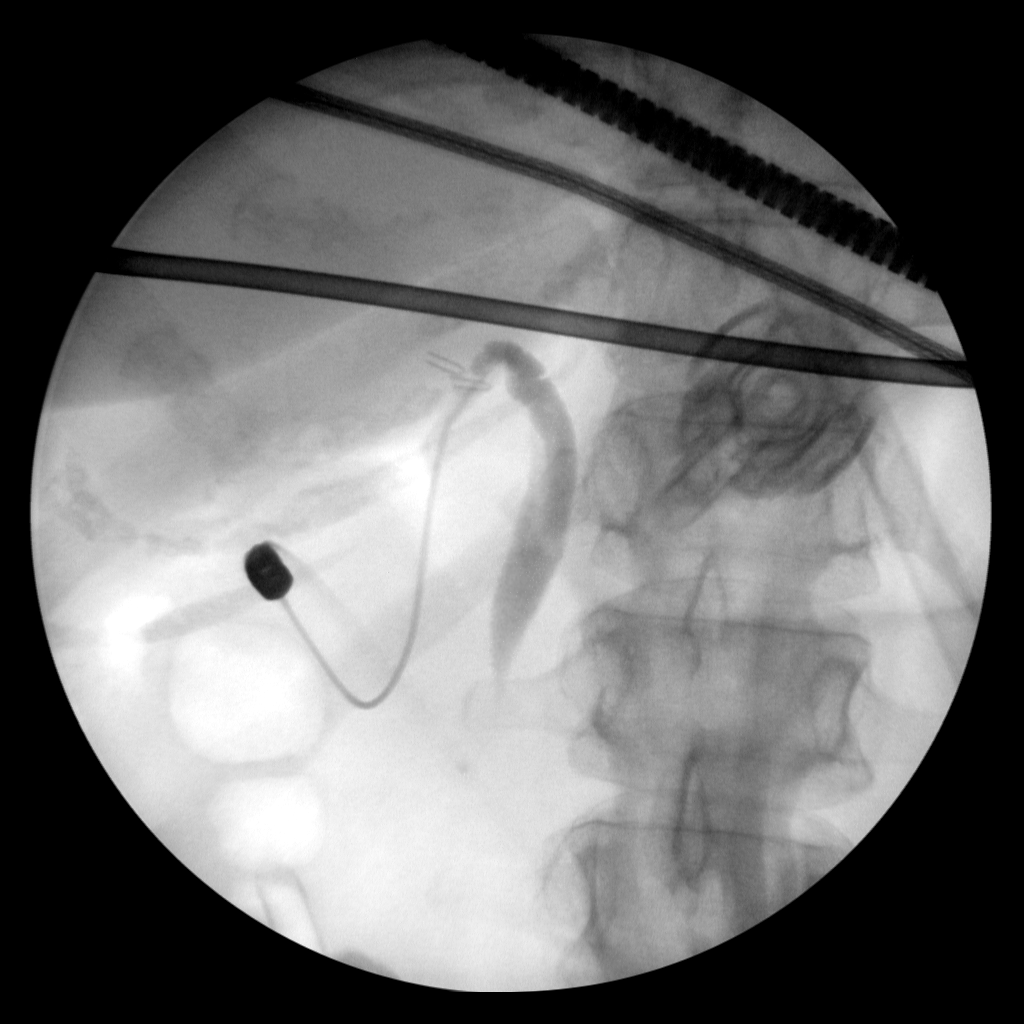
[frame 22/49]
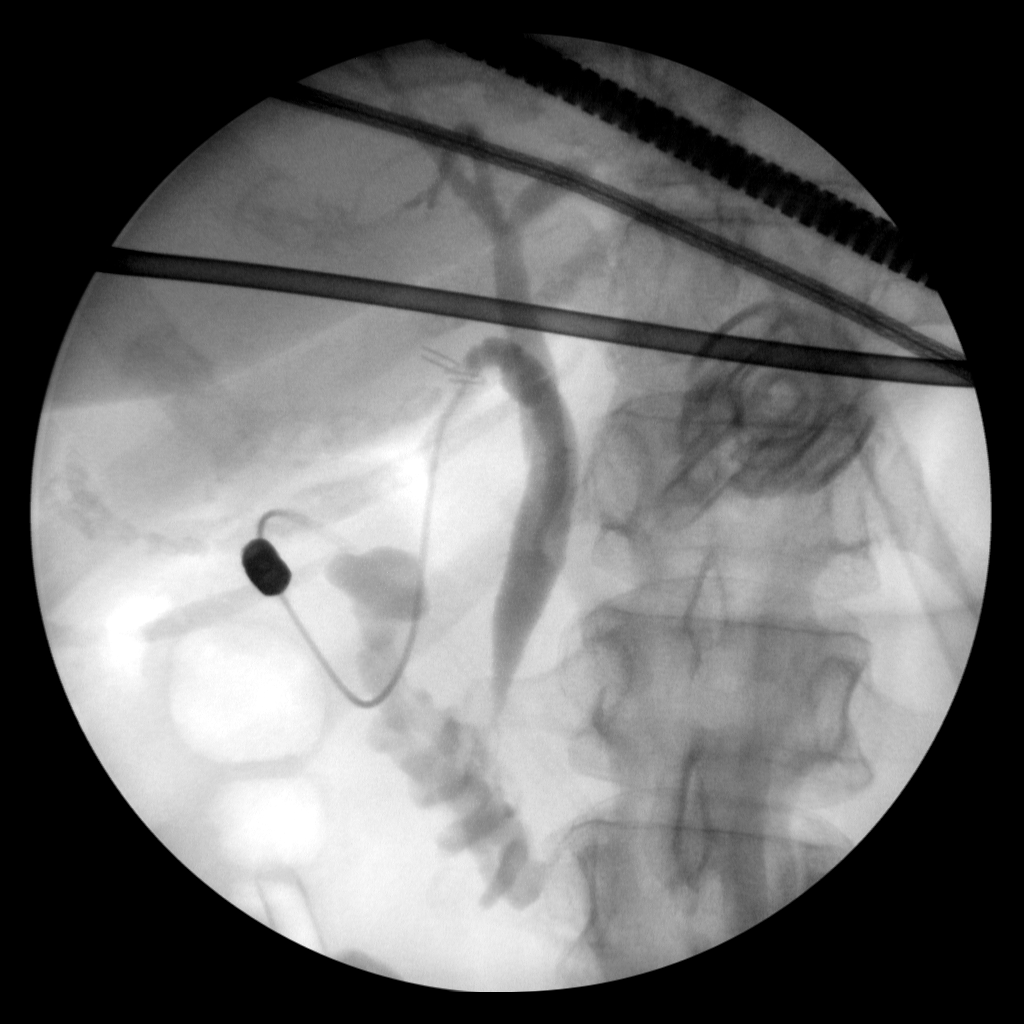
[frame 25/49]
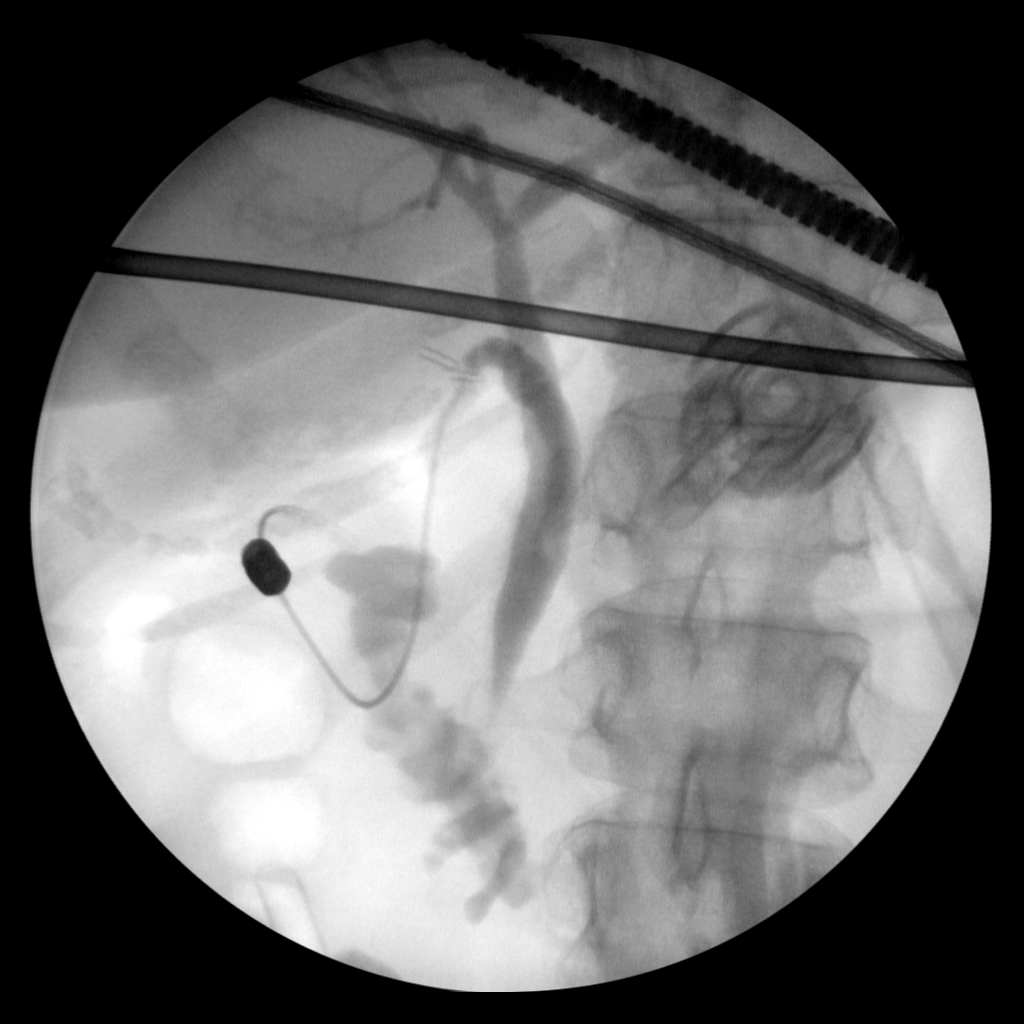
[frame 42/49]
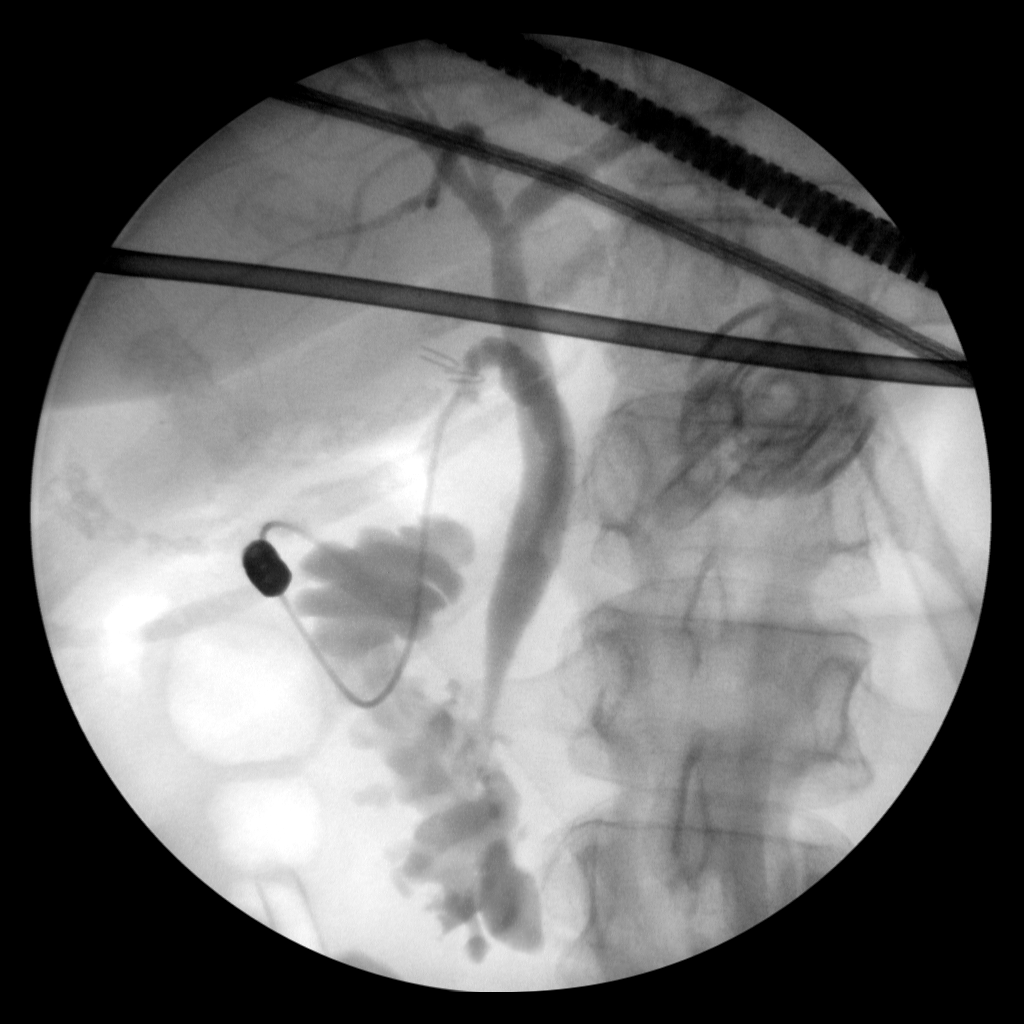

[4 of 4 positions shown; findings below may reference images not displayed]

FINDINGS: Intraoperative cholangiogram shows a normal opacified biliary tree
without evidence of filling defect or biliary obstruction. Contrast
enters the duodenum normally. No extravasation of contrast
identified.
IMPRESSION: Normal intraoperative cholangiogram.

## 2019-11-09 ENCOUNTER — Other Ambulatory Visit: Payer: Self-pay

## 2019-11-09 ENCOUNTER — Ambulatory Visit: Payer: BC Managed Care – PPO | Attending: Internal Medicine

## 2019-11-09 DIAGNOSIS — Z20822 Contact with and (suspected) exposure to covid-19: Secondary | ICD-10-CM

## 2019-11-10 LAB — NOVEL CORONAVIRUS, NAA: SARS-CoV-2, NAA: NOT DETECTED

## 2021-02-09 ENCOUNTER — Encounter (INDEPENDENT_AMBULATORY_CARE_PROVIDER_SITE_OTHER): Payer: Self-pay | Admitting: *Deleted

## 2021-03-22 ENCOUNTER — Other Ambulatory Visit (INDEPENDENT_AMBULATORY_CARE_PROVIDER_SITE_OTHER): Payer: Self-pay

## 2021-04-10 ENCOUNTER — Ambulatory Visit (INDEPENDENT_AMBULATORY_CARE_PROVIDER_SITE_OTHER): Payer: BC Managed Care – PPO | Admitting: Gastroenterology

## 2021-04-24 ENCOUNTER — Other Ambulatory Visit (HOSPITAL_COMMUNITY): Payer: BC Managed Care – PPO

## 2021-07-17 ENCOUNTER — Telehealth (INDEPENDENT_AMBULATORY_CARE_PROVIDER_SITE_OTHER): Payer: Self-pay

## 2021-07-17 ENCOUNTER — Encounter (INDEPENDENT_AMBULATORY_CARE_PROVIDER_SITE_OTHER): Payer: Self-pay

## 2021-07-17 ENCOUNTER — Other Ambulatory Visit (INDEPENDENT_AMBULATORY_CARE_PROVIDER_SITE_OTHER): Payer: Self-pay

## 2021-07-17 ENCOUNTER — Encounter (INDEPENDENT_AMBULATORY_CARE_PROVIDER_SITE_OTHER): Payer: Self-pay | Admitting: Gastroenterology

## 2021-07-17 ENCOUNTER — Ambulatory Visit (INDEPENDENT_AMBULATORY_CARE_PROVIDER_SITE_OTHER): Payer: BC Managed Care – PPO | Admitting: Gastroenterology

## 2021-07-17 ENCOUNTER — Other Ambulatory Visit: Payer: Self-pay

## 2021-07-17 VITALS — BP 154/78 | HR 80 | Temp 98.2°F | Ht 62.0 in | Wt 133.4 lb

## 2021-07-17 DIAGNOSIS — K5904 Chronic idiopathic constipation: Secondary | ICD-10-CM | POA: Diagnosis not present

## 2021-07-17 DIAGNOSIS — K6289 Other specified diseases of anus and rectum: Secondary | ICD-10-CM

## 2021-07-17 DIAGNOSIS — K5900 Constipation, unspecified: Secondary | ICD-10-CM | POA: Insufficient documentation

## 2021-07-17 NOTE — Patient Instructions (Signed)
Start taking Miralax 1 capful every day for one week. If bowel movements do not improve, increase to 1 capful every 12 hours. If after two weeks there is no improvement, increase to 1 capful every 8 hours Schedule colonoscopy

## 2021-07-17 NOTE — Telephone Encounter (Signed)
Jamie Stephenson, CMA  

## 2021-07-17 NOTE — Progress Notes (Signed)
Jamie Stephenson, M.D. Gastroenterology & Hepatology [place] [facility] [address] [place], [place] [address] Primary Care Physician: Rory Percy, MD [place] [address]  Referring MD: PCP  Chief Complaint: Rectal pain and constipation  History of Present Illness: Jamie Stephenson is a 60 y.o. female with past medical history of depression, seizure and memory disorder, who presents for evaluation of rectal pain and constipation.  Patient reports that for a few years (unclear for how long), she has presented intermittent episodes of severe rectal pain, that can last between 1 to 30 minutes. She has noticed it is sometimes triggered when having intercourse, but is not only related to this or related to a specific type of food. She has intermittent episodes 2 times per month but can last up to 3 months without having an event. She states that she has tried Tylenol in the past but has not found any improvement with it.  Was also advised to bend down in a fetal position but this has not provided any relief.  States in August 3rd she "had an upset stomach" characterized by nausea and did not have bowel movements until August 12th. Did not improve after taking Clearlax for two days, then used an enema which provided some relief. She has a longstanding history of constipation, which can last for up to a week. Has tried taking more fiber in her diet but is not laxative on a frequent basis. She does not feel any difference in her symptoms if she moves her bowels more often. Has felt some bloating.  She has felt a bump in her rectum for a "while" but does not know how long.  She is concerned about this as she does not know if she has a hemorrhoid.  She has gained 10 lb in the last 6 months.  The patient denies having any vomiting, fever, chills, hematochezia, melena, hematemesis, abdominal distention, abdominal pain, diarrhea, jaundice, pruritus or  weight loss.  Last [id number] Last Colonoscopy:never  FHx: neg for any gastrointestinal/liver disease, no malignancies Social: neg smoking, alcohol or illicit drug use Surgical: hysterectomy, appendectomy, cholecystectomy  Past Medical History: Past Medical History:  Diagnosis Date   Depression    History of seizure    Memory disorder 12/22/2014   Stress incontinence, female     Past Surgical History: Past Surgical History:  Procedure Laterality Date   ABDOMINAL HYSTERECTOMY     APPENDECTOMY     CHOLECYSTECTOMY N/A 09/19/2014   Procedure: LAPAROSCOPIC CHOLECYSTECTOMY WITH INTRAOPERATIVE CHOLANGIOGRAM;  Surgeon: Leighton Ruff, MD;  Location: [facility];  Service: General;  Laterality: N/A;   ORIF FINGER / THUMB FRACTURE      Family History: Family History  Problem Relation Age of Onset   Seizures Father    Seizures Mother    CAD Mother    Hypertension Mother    Heart disease Brother     Social History: Social History   Tobacco Use  Smoking Status Never  Smokeless Tobacco Never   Social History   Substance and Sexual Activity  Alcohol Use No   Social History   Substance and Sexual Activity  Drug Use No    Allergies: No Known Allergies  Medications: Current Outpatient Medications  Medication Sig Dispense Refill   amphetamine-dextroamphetamine (ADDERALL XR) 20 MG 24 hr capsule Take 1 capsule (20 mg total) by mouth daily. 30 capsule 0   buPROPion (WELLBUTRIN XL) 300 MG 24 hr tablet Take 300 mg by  mouth daily.     estrogen-methylTESTOSTERone 0.625-1.25 MG per tablet Take 1 tablet by mouth daily.     valACYclovir (VALTREX) 500 MG tablet Take 500 mg by mouth daily.  0   VESICARE 10 MG tablet Take 10 mg by mouth every morning.  3   vitamin B-6 (PYRIDOXINE) 25 MG tablet Take 25 mg by mouth daily.     No current facility-administered medications for this visit.    Review of Systems: GENERAL: negative for malaise, night sweats HEENT: No changes in hearing or  vision, no nose bleeds or other nasal problems. NECK: Negative for lumps, goiter, pain and significant neck swelling RESPIRATORY: Negative for cough, wheezing CARDIOVASCULAR: Negative for chest pain, leg swelling, palpitations, orthopnea GI: SEE HPI MUSCULOSKELETAL: Negative for joint pain or swelling, back pain, and muscle pain. SKIN: Negative for lesions, rash PSYCH: Negative for sleep disturbance, mood disorder and recent psychosocial stressors. HEMATOLOGY Negative for prolonged bleeding, bruising easily, and swollen nodes. ENDOCRINE: Negative for cold or heat intolerance, polyuria, polydipsia and goiter. NEURO: negative for tremor, gait imbalance, syncope and seizures. The remainder of the review of systems is noncontributory.   Physical Exam: BP (!) 154/78 (BP Location: Left Arm, Patient Position: Sitting, Cuff Size: Small)   Pulse 80   Temp 98.2 F (36.8 C) (Oral)   Ht '5\' 2"'$  (1.575 m)   Wt 133 lb 6.4 oz (60.5 kg)   BMI 24.40 kg/m  GENERAL: The patient is AO x3, in no acute distress. HEENT: Head is normocephalic and atraumatic. EOMI are intact. Mouth is well hydrated and without lesions. NECK: Supple. No masses LUNGS: Clear to auscultation. No presence of rhonchi/wheezing/rales. Adequate chest expansion HEART: RRR, normal s1 and s2. ABDOMEN: Soft, nontender, no guarding, no peritoneal signs, and nondistended. BS +. No masses. RECTAL: normal external inspection, no masses or tenderness upon rectal inspection, normal tone, there is presence of possible internal hemorrhoids at 6 but no bleeding or stool in rectal vault. Chaperone - Gustavus Bryant, CMA. EXTREMITIES: Without any cyanosis, clubbing, rash, lesions or edema. NEUROLOGIC: AOx3, no focal motor deficit. SKIN: no jaundice, no rashes   Imaging/Labs: as above  I personally reviewed and interpreted the available labs, imaging and endoscopic files.  Impression and Plan: [name] is a 60 y.o. female with past  medical history of depression, seizure and memory disorder, who presents for evaluation of rectal pain and constipation.  The patient has presented some episodes of rectal pain of unclear etiology.  Rectal exam did not show any external lesions that would explain the pain, she has some small internal hemorrhoids and rectal examination but this should not cause the severe episodes of pain she has while sleeping.  Has not presented any red flag signs.  However, will need to explore this further with colonoscopy as she has never had one in the past and it is unclear why she has presented this pain.  It is possible she has exacerbation of the symptoms given her constipation, for which she was advised to take MiraLAX on a daily basis.  I explained to her that if her investigations are negative, we will need to explore this further with an anorectal manometry.  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  - Start taking Miralax 1 capful every day for one week. If bowel movements do not improve, increase to 1 capful every 12 hours. If after two weeks there is no improvement, increase to 1 capful every 8 hours - Schedule colonoscopy  All questions were answered.      Jamie Peppers, MD Gastroenterology and Hepatology [facility]

## 2021-08-30 ENCOUNTER — Ambulatory Visit (HOSPITAL_COMMUNITY)
Admission: RE | Admit: 2021-08-30 | Discharge: 2021-08-30 | Disposition: A | Discharge status: Home / Self Care | Payer: BC Managed Care – PPO | Attending: Gastroenterology | Admitting: Gastroenterology

## 2021-08-30 ENCOUNTER — Encounter (HOSPITAL_COMMUNITY)
Admission: RE | Disposition: A | Discharge status: Home / Self Care | Payer: Self-pay | Source: Home / Self Care | Attending: Gastroenterology

## 2021-08-30 ENCOUNTER — Other Ambulatory Visit: Payer: Self-pay

## 2021-08-30 ENCOUNTER — Encounter (HOSPITAL_COMMUNITY): Payer: Self-pay | Admitting: Gastroenterology

## 2021-08-30 ENCOUNTER — Ambulatory Visit (HOSPITAL_COMMUNITY): Payer: BC Managed Care – PPO | Admitting: Anesthesiology

## 2021-08-30 DIAGNOSIS — D124 Benign neoplasm of descending colon: Secondary | ICD-10-CM | POA: Insufficient documentation

## 2021-08-30 DIAGNOSIS — Z9049 Acquired absence of other specified parts of digestive tract: Secondary | ICD-10-CM | POA: Insufficient documentation

## 2021-08-30 DIAGNOSIS — K5904 Chronic idiopathic constipation: Secondary | ICD-10-CM | POA: Diagnosis not present

## 2021-08-30 DIAGNOSIS — K648 Other hemorrhoids: Secondary | ICD-10-CM | POA: Insufficient documentation

## 2021-08-30 DIAGNOSIS — Z79899 Other long term (current) drug therapy: Secondary | ICD-10-CM | POA: Diagnosis not present

## 2021-08-30 DIAGNOSIS — D122 Benign neoplasm of ascending colon: Secondary | ICD-10-CM | POA: Insufficient documentation

## 2021-08-30 DIAGNOSIS — K6289 Other specified diseases of anus and rectum: Secondary | ICD-10-CM | POA: Diagnosis present

## 2021-08-30 HISTORY — PX: POLYPECTOMY: SHX5525

## 2021-08-30 HISTORY — PX: COLONOSCOPY WITH PROPOFOL: SHX5780

## 2021-08-30 LAB — HM COLONOSCOPY

## 2021-08-30 SURGERY — COLONOSCOPY WITH PROPOFOL
Anesthesia: General

## 2021-08-30 MED ORDER — LACTATED RINGERS IV SOLN: INTRAVENOUS | Status: DC

## 2021-08-30 MED ORDER — PROPOFOL 500 MG/50ML IV EMUL
INTRAVENOUS | Status: DC | PRN
  Administered 2021-08-30: 150 ug/kg/min via INTRAVENOUS

## 2021-08-30 MED ORDER — STERILE WATER FOR IRRIGATION IR SOLN
Status: DC | PRN
  Administered 2021-08-30: 200 mL

## 2021-08-30 MED ORDER — PHENYLEPHRINE HCL (PRESSORS) 10 MG/ML IV SOLN
INTRAVENOUS | Status: DC | PRN
  Administered 2021-08-30 (×2): 50 ug via INTRAVENOUS

## 2021-08-30 MED ORDER — PROPOFOL 500 MG/50ML IV EMUL
INTRAVENOUS | Status: AC
  Filled 2021-08-30: qty 50

## 2021-08-30 NOTE — H&P (Signed)
Jamie Stephenson is an 59 y.o. female.   Chief Complaint: rectal pain HPI: Jamie Stephenson is a 60 y.o. female with past medical history of depression, seizure and memory disorder, who presents for evaluation of rectal pain  The patient has presented intermittent episodes of rectal pain for the last few years which have been self-limited.  Since the last time she saw me in the office had 1 only episode of rectal pain that lasted 10 minutes and resolved on its own.  Denies any blood in the stool, black stools, nausea or vomiting.  No other symptoms such as weight loss, changes in her bowel movement frequency or abdominal distention/pain.  Past Medical History:  Diagnosis Date   Depression    History of seizure    Memory disorder 12/22/2014   Stress incontinence, female     Past Surgical History:  Procedure Laterality Date   ABDOMINAL HYSTERECTOMY     APPENDECTOMY     CHOLECYSTECTOMY N/A 09/19/2014   Procedure: LAPAROSCOPIC CHOLECYSTECTOMY WITH INTRAOPERATIVE CHOLANGIOGRAM;  Surgeon: Leighton Ruff, MD;  Location: [facility];  Service: General;  Laterality: N/A;   ORIF FINGER / THUMB FRACTURE      Family History  Problem Relation Age of Onset   Seizures Father    Seizures Mother    CAD Mother    Hypertension Mother    Heart disease Brother    Social History:  reports that she has never smoked. She has never used smokeless tobacco. She reports that she does not drink alcohol and does not use drugs.  Allergies: No Known Allergies  Medications Prior to Admission  Medication Sig Dispense Refill   amphetamine-dextroamphetamine (ADDERALL XR) 20 MG 24 hr capsule Take 1 capsule (20 mg total) by mouth daily. 30 capsule 0   buPROPion (WELLBUTRIN XL) 300 MG 24 hr tablet Take 300 mg by mouth daily.     estradiol (ESTRACE) 1 MG tablet Take 1 mg by mouth daily.     pyridOXINE (VITAMIN B-6) 100 MG tablet Take 100 mg by mouth daily.     valACYclovir (VALTREX) 500 MG tablet Take 500 mg by mouth daily.  0    VESICARE 10 MG tablet Take 10 mg by mouth every morning.  3   acetaminophen (TYLENOL) 500 MG tablet Take 1,000 mg by mouth every 6 (six) hours as needed for moderate pain or headache.      No results found for this or any previous visit (from the past 48 hour(s)). No results found.  Review of Systems  Constitutional: Negative.   HENT: Negative.    Eyes: Negative.   Respiratory: Negative.    Cardiovascular: Negative.   Gastrointestinal:  Positive for rectal pain.  Endocrine: Negative.   Genitourinary: Negative.   Musculoskeletal: Negative.   Skin: Negative.   Allergic/Immunologic: Negative.   Neurological: Negative.   Hematological: Negative.   Psychiatric/Behavioral: Negative.     Blood pressure (!) 149/74, pulse 63, temperature 98.6 F (37 C), temperature source Oral, resp. rate 19, height 5\' 2"  (1.575 m), weight 59.9 kg, SpO2 100 %. Physical Exam  GENERAL: The patient is AO x3, in no acute distress. HEENT: Head is normocephalic and atraumatic. EOMI are intact. Mouth is well hydrated and without lesions. NECK: Supple. No masses LUNGS: Clear to auscultation. No presence of rhonchi/wheezing/rales. Adequate chest expansion HEART: RRR, normal s1 and s2. ABDOMEN: Soft, nontender, no guarding, no peritoneal signs, and nondistended. BS +. No masses. EXTREMITIES: Without any cyanosis, clubbing, rash, lesions or edema. NEUROLOGIC:  AOx3, no focal motor deficit. SKIN: no jaundice, no rashes  Assessment/Plan Jamie Stephenson is a 60 y.o. female with past medical history of depression, seizure and memory disorder, who presents for evaluation of rectal pain.  We will proceed with colonoscopy.  Harvel Quale, MD 08/30/2021, 8:35 AM

## 2021-08-30 NOTE — Discharge Instructions (Addendum)
You are being discharged to home.  Resume your previous diet.  We are waiting for your pathology results.  Your physician has recommended a repeat colonoscopy for surveillance based on pathology results.  Can consider using low dose Elavil if recurrent episodes of proctalgia fugax.

## 2021-08-30 NOTE — Transfer of Care (Signed)
Immediate Anesthesia Transfer of Care Note  Patient: Jamie Stephenson  Procedure(s) Performed: COLONOSCOPY WITH PROPOFOL POLYPECTOMY  Patient Location: PACU  Anesthesia Type:General  Level of Consciousness: awake, drowsy and patient cooperative  Airway & Oxygen Therapy: Patient Spontanous Breathing  Post-op Assessment: Report given to RN, Post -op Vital signs reviewed and stable and Patient moving all extremities X 4  Post vital signs: Reviewed and stable  Last Vitals:  Vitals Value Taken Time  BP    Temp    Pulse    Resp    SpO2      Last Pain:  Vitals:   08/30/21 0842  TempSrc:   PainSc: 0-No pain      Patients Stated Pain Goal: 6 (04/88/89 1694)  Complications: No notable events documented.

## 2021-08-30 NOTE — Anesthesia Preprocedure Evaluation (Addendum)
Anesthesia Evaluation  Patient identified by MRN, date of birth, ID band Patient awake    Reviewed: Allergy & Precautions, NPO status , Patient's Chart, lab work & pertinent test results  History of Anesthesia Complications Negative for: history of anesthetic complications  Airway Mallampati: II  TM Distance: >3 FB Neck ROM: Full    Dental  (+) Dental Advisory Given, Caps   Pulmonary neg pulmonary ROS,    Pulmonary exam normal breath sounds clear to auscultation       Cardiovascular Exercise Tolerance: Good Normal cardiovascular exam Rhythm:Regular Rate:Normal     Neuro/Psych Seizures - (last seizure 40 years ago, not on meds),  PSYCHIATRIC DISORDERS Depression    GI/Hepatic negative GI ROS, Neg liver ROS,   Endo/Other  negative endocrine ROS  Renal/GU negative Renal ROS     Musculoskeletal negative musculoskeletal ROS (+)   Abdominal   Peds  Hematology negative hematology ROS (+)   Anesthesia Other Findings   Reproductive/Obstetrics negative OB ROS                            Anesthesia Physical Anesthesia Plan  ASA: 2  Anesthesia Plan: General   Post-op Pain Management:    Induction: Intravenous  PONV Risk Score and Plan: TIVA  Airway Management Planned: Nasal Cannula and Natural Airway  Additional Equipment:   Intra-op Plan:   Post-operative Plan:   Informed Consent: I have reviewed the patients History and Physical, chart, labs and discussed the procedure including the risks, benefits and alternatives for the proposed anesthesia with the patient or authorized representative who has indicated his/her understanding and acceptance.     Dental advisory given  Plan Discussed with: CRNA and Surgeon  Anesthesia Plan Comments:         Anesthesia Quick Evaluation

## 2021-08-30 NOTE — Op Note (Signed)
[facility] Patient Name: Jamie Stephenson Procedure Date: 08/30/2021 8:17 AM MRN: [id number] Date of Birth: [birth year] Attending MD: [name] ,  CSN: [id number] Age: 60 Admit Type: Outpatient Procedure:                Colonoscopy Indications:              Rectal pain Providers:                [name], Herbert Pun,                            Technician Referring MD:              Medicines:                Monitored Anesthesia Care Complications:            No immediate complications. Estimated Blood Loss:     Estimated blood loss: none. Procedure:                Pre-Anesthesia Assessment:                           - Prior to the procedure, a History and Physical                            was performed, and patient medications, allergies                            and sensitivities were reviewed. The patient's                            tolerance of previous anesthesia was reviewed.                           - The risks and benefits of the procedure and the                            sedation options and risks were discussed with the                            patient. All questions were answered and informed                            consent was obtained.                           - ASA Grade Assessment: II - A patient with mild                            systemic disease.                           After obtaining informed consent, the colonoscope                            was passed under direct vision. Throughout the  procedure, the patient's blood pressure, pulse, and                            oxygen saturations were monitored continuously. The                            [id number]-[id number] ([id number]) scope was introduced through                            the anus and advanced to the the cecum, identified                            by appendiceal orifice and ileocecal valve. The                            colonoscopy was  performed without difficulty. The                            patient tolerated the procedure well. The quality                            of the bowel preparation was good. Scope In: 8:47:36 AM Scope Out: 9:23:22 AM Scope Withdrawal Time: 0 hours 24 minutes 26 seconds  Total Procedure Duration: 0 hours 35 minutes 46 seconds  Findings:      The perianal and digital rectal examinations were normal.      Three sessile polyps were found in the descending colon and ascending       colon. The polyps were 4 to 6 mm in size. These polyps were removed with       a cold snare. Resection and retrieval were complete.      Non-bleeding internal hemorrhoids were found during retroflexion. The       hemorrhoids were small. Impression:               - Three 4 to 6 mm polyps in the descending colon                            and in the ascending colon, removed with a cold                            snare. Resected and retrieved.                           - Non-bleeding internal hemorrhoids. Moderate Sedation:      Per Anesthesia Care Recommendation:           - Discharge patient to home (ambulatory).                           - Resume previous diet.                           - Await pathology results.                           - Repeat colonoscopy for surveillance  based on                            pathology results.                           - Can consider using low dose Elavil if recurrent                            episodes of proctalgia fugax. Procedure Code(s):        --- Professional ---                           [phone], Colonoscopy, flexible; with removal of                            tumor(s), polyp(s), or other lesion(s) by snare                            technique Diagnosis Code(s):        --- Professional ---                           K63.5, Polyp of colon                           K64.8, Other hemorrhoids                           K62.89, Other specified diseases of anus and rectum CPT  copyright 2019 American Medical Association. All rights reserved. The codes documented in this report are preliminary and upon coder review may  be revised to meet current compliance requirements. [name], MD [name],  08/30/2021 9:28:56 AM This report has been signed electronically. Number of Addenda: 0

## 2021-08-30 NOTE — Anesthesia Postprocedure Evaluation (Signed)
Anesthesia Post Note  Patient: Jamie Stephenson  Procedure(s) Performed: COLONOSCOPY WITH PROPOFOL POLYPECTOMY  Patient location during evaluation: Endoscopy Anesthesia Type: General Level of consciousness: awake and alert and oriented Pain management: pain level controlled Vital Signs Assessment: post-procedure vital signs reviewed and stable Respiratory status: spontaneous breathing and respiratory function stable Cardiovascular status: blood pressure returned to baseline and stable Postop Assessment: no apparent nausea or vomiting Anesthetic complications: no   No notable events documented.   Last Vitals:  Vitals:   08/30/21 0716 08/30/21 0928  BP: (!) 149/74 122/62  Pulse:    Resp:  20  Temp:  36.6 C  SpO2:  100%    Last Pain:  Vitals:   08/30/21 0928  TempSrc: Axillary  PainSc: 0-No pain                 Shaivi Rothschild C Leocadio Heal

## 2021-08-31 LAB — SURGICAL PATHOLOGY

## 2021-09-04 ENCOUNTER — Encounter (HOSPITAL_COMMUNITY): Payer: Self-pay | Admitting: Gastroenterology

## 2021-09-05 ENCOUNTER — Encounter (INDEPENDENT_AMBULATORY_CARE_PROVIDER_SITE_OTHER): Payer: Self-pay | Admitting: *Deleted
